# Patient Record
Sex: Female | Born: 2005 | Race: White | Hispanic: No | Marital: Single | Smoking: Never smoker
Health system: Southern US, Community
[De-identification: ages and names within clinical notes are randomized; demographics above are authoritative.]

## PROBLEM LIST (undated history)

## (undated) ENCOUNTER — Ambulatory Visit: Payer: MEDICAID

## (undated) DIAGNOSIS — K589 Irritable bowel syndrome without diarrhea: Secondary | ICD-10-CM

## (undated) DIAGNOSIS — G43909 Migraine, unspecified, not intractable, without status migrainosus: Secondary | ICD-10-CM

## (undated) DIAGNOSIS — F329 Major depressive disorder, single episode, unspecified: Secondary | ICD-10-CM

## (undated) DIAGNOSIS — K219 Gastro-esophageal reflux disease without esophagitis: Secondary | ICD-10-CM

## (undated) DIAGNOSIS — N189 Chronic kidney disease, unspecified: Secondary | ICD-10-CM

## (undated) DIAGNOSIS — Q680 Congenital deformity of sternocleidomastoid muscle: Secondary | ICD-10-CM

## (undated) DIAGNOSIS — J45909 Unspecified asthma, uncomplicated: Secondary | ICD-10-CM

## (undated) DIAGNOSIS — F419 Anxiety disorder, unspecified: Secondary | ICD-10-CM

## (undated) DIAGNOSIS — T7840XA Allergy, unspecified, initial encounter: Secondary | ICD-10-CM

## (undated) DIAGNOSIS — F32A Depression, unspecified: Secondary | ICD-10-CM

## (undated) DIAGNOSIS — G90A Postural orthostatic tachycardia syndrome (POTS): Secondary | ICD-10-CM

## (undated) HISTORY — PX: EYE MUSCLE SURGERY: SHX370

## (undated) HISTORY — DX: Depression, unspecified: F32.A

## (undated) HISTORY — DX: Migraine, unspecified, not intractable, without status migrainosus: G43.909

## (undated) HISTORY — PX: WISDOM TOOTH EXTRACTION: SHX21

## (undated) HISTORY — DX: Congenital deformity of sternocleidomastoid muscle: Q68.0

## (undated) HISTORY — DX: Postural orthostatic tachycardia syndrome (POTS): G90.A

## (undated) HISTORY — DX: Gastro-esophageal reflux disease without esophagitis: K21.9

## (undated) HISTORY — DX: Chronic kidney disease, unspecified: N18.9

## (undated) HISTORY — DX: Major depressive disorder, single episode, unspecified: F32.9

## (undated) HISTORY — DX: Allergy, unspecified, initial encounter: T78.40XA

---
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
## 2023-01-02 ENCOUNTER — Ambulatory Visit: Payer: MEDICAID

## 2023-01-07 ENCOUNTER — Ambulatory Visit (INDEPENDENT_AMBULATORY_CARE_PROVIDER_SITE_OTHER): Payer: MEDICAID | Admitting: Physician Assistant

## 2023-01-07 ENCOUNTER — Encounter: Payer: Self-pay | Admitting: Physician Assistant

## 2023-01-07 ENCOUNTER — Ambulatory Visit: Payer: MEDICAID

## 2023-01-07 VITALS — BP 96/66 | HR 90 | Temp 98.5°F | Ht 67.0 in | Wt 179.8 lb

## 2023-01-07 DIAGNOSIS — N760 Acute vaginitis: Secondary | ICD-10-CM | POA: Diagnosis not present

## 2023-01-07 DIAGNOSIS — G90A Postural orthostatic tachycardia syndrome (POTS): Secondary | ICD-10-CM | POA: Diagnosis not present

## 2023-01-07 DIAGNOSIS — R42 Dizziness and giddiness: Secondary | ICD-10-CM

## 2023-01-07 DIAGNOSIS — B9689 Other specified bacterial agents as the cause of diseases classified elsewhere: Secondary | ICD-10-CM

## 2023-01-07 MED ORDER — METRONIDAZOLE 0.75 % VA GEL
1.0000 | Freq: Every day | VAGINAL | 0 refills | Status: AC
Start: 2023-01-07 — End: 2023-01-14

## 2023-01-07 MED ORDER — METOPROLOL SUCCINATE ER 25 MG PO TB24
12.5000 mg | ORAL_TABLET | Freq: Every day | ORAL | 2 refills | Status: AC
Start: 2023-01-07 — End: ?

## 2023-01-07 NOTE — Assessment & Plan Note (Addendum)
Chronic, ongoing Patient reports she is having alternating chest pains and pressure due to lability of HR with position changes She has been seeing Cardiology and has upcoming apt with Electrophysiology this week Reviewed most recent Cardiology and PT notes  She is still taking metoprolol 12.5 mg PO every day -refills provided today  Continue collaboration with Cardiology and PT for management Still awaiting apt with [facility] in Dec  Continue current regimen for now  Follow up as needed for persistent or progressing symptoms

## 2023-01-07 NOTE — Progress Notes (Signed)
Acute Office Visit   Patient: Michelle Stokes   DOB: 2006-02-10   17 y.o. Female  MRN: [id number] Visit Date: 01/07/2023  Today's healthcare provider: Oswaldo Conroy Michelle Melder, Michelle Stokes  Introduced myself to the patient as a Secondary school teacher and provided education on APPs in clinical practice.    No chief complaint on file.  Subjective    HPI    POTS/ chest pain and pressure   She is being seen by Cardiology She reports she is feeling shaky and drained most of the time She reports some pre-syncopal episodes especially when standing up or with position changes She just completed a longterm heart monitor and was referred to electrophysiologist- has apt later this week She is scheduled with [facility] in Dec  She is doing PT and rehab- she reports it is going okay  She reports intermittent leg swelling and is using her compression stockings as directed    BV She reports the taste of oral flagyl made her feel nauseous and she was not able to take it  Requesting alternative     Medications: Outpatient Medications Prior to Visit  Medication Sig   acetaminophen (TYLENOL) 325 MG tablet Take 150 mg by mouth every 6 (six) hours as needed.   albuterol (PROVENTIL) (2.5 MG/3ML) 0.083% nebulizer solution Take 3 mLs (2.5 mg total) by nebulization every 6 (six) hours as needed for wheezing or shortness of breath.   Albuterol-Budesonide 90-80 MCG/ACT AERO Inhale 1 Inhaler into the lungs every 6 (six) hours as needed.   Cholecalciferol 1.25 MG (50000 UT) TABS Take 1 tablet by mouth 2 (two) times a week. For 12 weeks and then stop.  Return to office for lab draw.   meclizine (ANTIVERT) 25 MG tablet Take 1 tablet (25 mg total) by mouth 3 (three) times daily as needed for dizziness.   ondansetron (ZOFRAN-ODT) 4 MG disintegrating tablet Take 1 tablet (4 mg total) by mouth every 8 (eight) hours as needed for nausea or vomiting.   pantoprazole (PROTONIX) 40 MG tablet Take 40 mg by mouth daily.    [DISCONTINUED] metoprolol succinate (TOPROL-XL) 25 MG 24 hr tablet TAKE 1/2 TABLET(12.5 MG) BY MOUTH DAILY   [DISCONTINUED] medroxyPROGESTERone (PROVERA) 10 MG tablet Take 1 tablet (10 mg total) by mouth daily for 7 days.   No facility-administered medications prior to visit.    Review of Systems  Neurological:  Positive for dizziness. Negative for syncope and weakness.         Objective    BP 96/66   Pulse 90   Temp 98.5 F (36.9 C) (Oral)   Ht 5\' 7"  (1.702 m)   Wt 179 lb 12.8 oz (81.6 kg)   SpO2 96%   BMI 28.16 kg/m      Physical Exam Vitals reviewed.  Constitutional:      General: She is awake. She is not in acute distress.    Appearance: Normal appearance. She is well-developed and well-groomed. She is not ill-appearing or toxic-appearing.  HENT:     Head: Normocephalic and atraumatic.  Eyes:     General: Lids are normal. Gaze aligned appropriately.  Cardiovascular:     Rate and Rhythm: Normal rate and regular rhythm.     Heart sounds: Normal heart sounds.  Pulmonary:     Effort: Pulmonary effort is normal.     Breath sounds: Normal breath sounds. No decreased air movement. No decreased breath sounds, wheezing, rhonchi or rales.  Neurological:  General: No focal deficit present.     Mental Status: She is alert and oriented to person, place, and time. Mental status is at baseline.     GCS: GCS eye subscore is 4. GCS verbal subscore is 5. GCS motor subscore is 6.     Cranial Nerves: Cranial nerves 2-12 are intact.     Motor: No weakness, tremor or atrophy.  Psychiatric:        Attention and Perception: Attention and perception normal.        Mood and Affect: Mood and affect normal.        Speech: Speech normal.        Behavior: Behavior normal. Behavior is cooperative.       No results found for any visits on 01/07/23.  Assessment & Plan      No follow-ups on file.      Problem List Items Addressed This Visit       Cardiovascular and  Mediastinum   POTS (postural orthostatic tachycardia syndrome) - Primary    Chronic, ongoing Patient reports she is having alternating chest pains and pressure due to lability of HR with position changes She has been seeing Cardiology and has upcoming apt with Electrophysiology this week Reviewed most recent Cardiology and PT notes  She is still taking metoprolol 12.5 mg PO every day -refills provided today  Continue collaboration with Cardiology and PT for management Still awaiting apt with [facility] in Dec  Continue current regimen for now  Follow up as needed for persistent or progressing symptoms        Relevant Medications   metoprolol succinate (TOPROL-XL) 25 MG 24 hr tablet     Other   Dizzy   Relevant Medications   metoprolol succinate (TOPROL-XL) 25 MG 24 hr tablet   Other Visit Diagnoses     Bacterial vaginosis     Acute, ongoing She states she was not able to take the flagyl oral tablets due to taste Will provide vaginal gel to treat BV Follow up as needed for persistent or progressing symptoms     Relevant Medications   metroNIDAZOLE (METROGEL) 0.75 % vaginal gel        No follow-ups on file.   I, Michelle Winnett E Fable Huisman, Michelle Stokes, have reviewed all documentation for this visit. The documentation on 01/07/23 for the exam, diagnosis, procedures, and orders are all accurate and complete.   Michelle Stokes, Michelle Stokes, Michelle Stokes [facility]

## 2023-01-08 ENCOUNTER — Telehealth: Payer: Self-pay | Admitting: Cardiology

## 2023-01-08 NOTE — Telephone Encounter (Signed)
Pt calling to talk with nurse about heart monitor results and scheduling. Please advise.

## 2023-01-08 NOTE — Telephone Encounter (Signed)
Patient aware monitor results have not been finalized the provider. Once finalized we can give her a call with results.  She was concerned that EP appointment is a month out. I did inform her that they are pretty booked up as well. They do have her on cancellation list.

## 2023-01-09 ENCOUNTER — Institutional Professional Consult (permissible substitution): Payer: Self-pay | Admitting: Cardiology

## 2023-01-09 ENCOUNTER — Ambulatory Visit: Payer: MEDICAID

## 2023-01-14 ENCOUNTER — Telehealth: Payer: Self-pay | Admitting: Cardiology

## 2023-01-14 ENCOUNTER — Ambulatory Visit: Payer: MEDICAID

## 2023-01-14 DIAGNOSIS — R262 Difficulty in walking, not elsewhere classified: Secondary | ICD-10-CM

## 2023-01-14 DIAGNOSIS — M6281 Muscle weakness (generalized): Secondary | ICD-10-CM

## 2023-01-14 NOTE — Telephone Encounter (Signed)
Patient aware heart monitor results have not been finalized by provider. Duplicate encounter see previous encounter already forwarded to Dr. Bjorn Pippin

## 2023-01-14 NOTE — Telephone Encounter (Signed)
Patient is requesting call back to get update on monitor results.

## 2023-01-14 NOTE — Telephone Encounter (Signed)
Left message to call the clinic. 

## 2023-01-14 NOTE — Telephone Encounter (Signed)
Patient returning call. Please advise

## 2023-01-14 NOTE — Therapy (Signed)
OUTPATIENT PHYSICAL THERAPY TREATMENT NOTE     Patient Name: Michelle Stokes MRN: [id number] DOB:Aug 17, 2005, 17 y.o., female Today's Date: 01/14/2023   PCP: [name], NP REFERRING PROVIDER: Little Ishikawa, MD    PT End of Session - 01/14/23 0759     Visit Number 55    Number of Visits 74    Date for PT Re-Evaluation 02/25/23    Authorization Type Medicaid [place]    Authorization Time Period 08/13/22-10/08/22    Progress Note Due on Visit 60    PT Start Time 0801    PT Stop Time 0826    PT Time Calculation (min) 25 min    Equipment Utilized During Treatment Gait belt    Activity Tolerance Patient tolerated treatment well;Patient limited by fatigue    Behavior During Therapy WFL for tasks assessed/performed                         Past Medical History:  Diagnosis Date   Allergy    seasonal    Anxiety    Asthma    Chronic kidney disease    Depression    GERD (gastroesophageal reflux disease)    IBS (irritable bowel syndrome)    Migraine    POTS (postural orthostatic tachycardia syndrome)    Torticollis, congenital    Past Surgical History:  Procedure Laterality Date   EYE MUSCLE SURGERY Bilateral    WISDOM TOOTH EXTRACTION     Patient Active Problem List   Diagnosis Date Noted   Chest pressure 12/08/2022   Cutaneous abscess of left axilla 03/14/2022   Dysphagia 02/23/2022   Dizzy 01/08/2022   POTS (postural orthostatic tachycardia syndrome) 01/08/2022   GERD (gastroesophageal reflux disease) 10/06/2021   Renal scarring 09/21/2021   Overweight (BMI 25.0-29.9) 05/30/2020   Recurrent major depressive disorder, in partial remission (HCC) 03/14/2020   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   Insomnia 08/10/2018   Migraine 07/06/2018   Anxiety 07/06/2018   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   Allergic rhinitis, seasonal 05/17/2015   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   Mild intermittent asthma without complication 05/17/2015   Recurrent UTI  (urinary tract infection) 05/17/2015   VUR (vesicoureteric reflux) 05/17/2015   Sacroiliitis (HCC) 05/04/2015   Chronic pain syndrome 05/04/2015   Exotropia, intermittent, monocular 03/22/2015   Family history of eye movement disorder 03/22/2015    ONSET DATE: Diagnosed July or August of 2023  REFERRING DIAG: G90.A (ICD-10-CM) - POTS (postural orthostatic tachycardia syndrome) and constipation.  THERAPY DIAG:  Muscle weakness (generalized)  Difficulty in walking, not elsewhere classified  Rationale for Evaluation and Treatment Rehabilitation  SUBJECTIVE:  SUBJECTIVE STATEMENT: Pt reports things have not been going well. Pt reports her symptoms have been terrible: "I've been dizzy all the time. My heart rate has gone sky high. I just don't feel good most of the time." Pt has a week or two left of classes. Work is going well, but has been tough with symptoms flaring up. Pt reports knees have been sore since working out this weekend.  Pain: sore knees  Pt accompanied by: self   From PPT EVAL: URINARY FUNCTION: pt voids approx. Twice a day and is prone to UTIs, urologist has asked pt to void every 2-3 hours. She does not have nocturia. She drinks one cup of orange juice in AM, then switches to water (60 oz.+) and Gatorade zero.  BOWEL FUNCTION: pt has one bowel movement a week and is about to try a new medication for constipation. Pain with bowel movement and blood 2/2 hemorrhoids. She tries to have fiber in her diet. Not currently taking any laxatives and stool softeners. CORE STABILITY: pt denied any surgeries or accidents which would impact core. SEXUAL FUNCTION: pt has never been sexually active. Pain with tampon insertion. Never had speculum inserted. She has never attempted orgasm.   PERTINENT  HISTORY:  Pt is a 17 y.o. female referred to PT for POTS. Pt diagnosed earlier this summer. Referred to PT due to dizziness with ADL's and physical activity. Reports after hospital visit in May where she had a viral infection pt was having low energy and dizziness. MD's think could be due to onset of having COVID. She went to her PCP where she was referred to cardiology where she received her diagnosis. Before metoprolol her HR would elevate to 170 BPM with basic activities like walking or showering. On metoprolol she still has HR up to 130's. Reports medication has not helped with hot spells, dizziness, or chest pressure. Pt denies any episodes of passing out but has had presyncopal episodes. 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 Pt does report balance issues with standing tasks with normal walking. Reports difficulty with stairs and unstable surfaces like grass. Some near falls but no falls. PLOF before POTS, pt involved in sports with track and field, cheer.  Pt denies any strenuous activity levels currently, just walking at the park. On a good day, pt walking 10-15 minutes. On a bad day 5 minutes where she requires seated rest breaks. No current access to the gym or gym equipment.  Recent orders received for sacroiliitis, pt additionally to be seen as a result for her ongoing LBP.  PAIN:  Are you having pain? No.  PRECAUTIONS: Fall  WEIGHT BEARING RESTRICTIONS No  FALLS: Has patient fallen in last 6 months? 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 Improve tolerance for activity, become more active, improve symptoms.  PHPT goals: help with  constipation, decr. Abdominal pain 2/2 stool burden, improve back pain.   OBJECTIVE:   DIAGNOSTIC FINDINGS: MRI Brain 03/09/2022: Normal MRI of the brain   COGNITION: Overall cognitive status: Within functional limits for tasks assessed  POSTURE: rounded shoulders and forward head  LOWER EXTREMITY MMT:  from ortho PT  note, kept for reference:  MMT Right Eval Left Eval  Hip flexion 4 4  Hip extension    Hip abduction 4 4  Hip adduction 4 4  Hip internal rotation 3+ 3+  Hip external rotation 4 4  Knee flexion 4+ 4  Knee extension 5 5  Ankle dorsiflexion 4 4  Ankle plantarflexion    Ankle inversion    Ankle eversion    (Blank rows = not tested)  Low back assessment completed 08/13/22:  Trunk/low back assessment:  Pt indicates low-mid back pain, points to central low back Palpation: pt TTP throughout L lower back musculature (not R) and and TTP throughout lumbar spine and to mid thoracic spine  ROM assessment of lumbo-thoracic spine Lateral flexion: lacking 5-10% bilateral and painful bilateral (felt a pulling pain in her sides) Flexion lacking 20%, pain-limited Rotation WFL ROM but pain-limited bilateral, mostly felt when rotating to the L   MMT: grossly 4/5 bilat LE and pain free  Slump test: positive bilat  On plinth: Hamstring length: RLE: lacking 42 deg.  LLE: lacking 40 deg.  Pain limited bilat with measurements   BED MOBILITY:  Sit to supine Complete Independence Supine to sit Complete Independence  GAIT: Gait pattern: step through pattern, decreased arm swing- Right, decreased arm swing- Left, decreased step length- Right, decreased step length- Left, decreased hip/knee flexion- Right, decreased hip/knee flexion- Left, narrow BOS, poor foot clearance- Right, and poor foot clearance- Left Distance walked: > 200' Assistive device utilized: None Level of assistance: SBA Comments: As pt fatigues with distance, slowed gait velocity noted with decreased foot  clearances  FUNCTIONAL TESTs:  30 seconds chair stand test: 7 reps. Age matched norms for females is 24. 2 minute walk test: 265' Dynamic Gait Index: Deferred to next session Functional gait assessment: Deferred to next session   ORTHOSTATICS:  Supine:   Seated: 117/71mm Hg, HR: 86 BPM   Standing: 116/84 mm Hg, HR: 96 BPM At the end of session Seated: 126/86 HR 81  Unable to stand 3 minutes to receive data*   PATIENT SURVEYS:  FOTO 25 with target score of 46  POSTURE: rounded shoulders, increased thoracic kyphosis, and anterior pelvic tilt  PELVIC ALIGNMENT: see above.  LUMBARAROM/PROM: WFL with pain along L lower back L sidebending.  A/PROM A/PROM  eval  Flexion   Extension   Right lateral flexion   Left lateral flexion   Right rotation   Left rotation    (Blank rows = not tested)  LOWER EXTREMITY MMT:  MMT Right eval Left eval  Hip flexion 4 4  Hip extension    Hip abduction    Hip adduction    Hip internal rotation 4 3+  Hip external rotation 4 4  Knee flexion 4- 4-  Knee extension 4+ 4+  Ankle dorsiflexion 4 4  Ankle plantarflexion    Ankle inversion    Ankle eversion     (Blank rows = not tested)  LOWER EXTREMITY ROM: all WFL  Active ROM Right eval Left eval  Hip flexion    Hip extension    Hip abduction    Hip adduction    Hip internal rotation    Hip external rotation    Knee flexion    Knee extension    Ankle dorsiflexion    Ankle plantarflexion    Ankle inversion    Ankle eversion     PALPATION:   General  pt reported TTP over lower back.  PELVIC MMT:   MMT eval  Vaginal   Internal Anal Sphincter   External Anal Sphincter   Puborectalis   Diastasis Recti   (Blank rows = not tested)        TONE: Based on external palpation PFM appears to experience incr. Tension and pt utilized B glute max during PFM contraction.  PROLAPSE: Not assessed.    TODAY'S TREATMENT: 01/14/23  TE  Nustep: Lvl 1 x 1  min Lvl 2 x 1 min Lvl 3 x 1 min  Lvl 2 x 1 min  Lvl 1 x 1 min  SPM 30s-45s  Pt states, "I don't have a bunch of energy. I'm about the same." Pt reports her knees.  With 2# AW donned each LE Seated: LAQ 1x12 each LE, 1x5  "More challenging today" Seated DF 15x bilat LE STS 1x  Seated PF 15x bilat LE Seated march 1x8 each LE STS 1x   Seated adductor pball squeeze 2x15 bilat LE   Matrix cable machine: Rows @ 2.5# 3x10 Seated trunk rotation 3x5 each direction @ 2.5#   PT reviewed activity modulation outside of PT to help avoid excessive fatigue, importance of completing activity at intensity/duration that does not result in flare next day.   Session terminated early per pt request to prevent excessive fatigue.   PATIENT EDUCATION:  Education details: Pt educated throughout session about proper posture and technique with exercises. Improved exercise technique, movement at target joints, use of target muscles after min to mod verbal, visual, tactile cues.   Person educated: Patient Education method: Explanation, Demonstration, and Verbal cues Education comprehension: verbalized understanding, returned demonstration, and needs further education  HOME EXERCISE PROGRAM: Med[employer]: [email or web address]  HOME EXERCISE PROGRAM: HEP from both neuro and pelvic PT:    Nmr: Access Code: [email or web address] URL: [email or web address] Date: 12/05/2022 Prepared by: Zerita Boers  Exercises - Supine Diaphragmatic Breathing  - 1 x daily - 7 x weekly - 1 sets - 5 reps Cues and demo for proper technique.   Updated 11/14/2022: PT instructed to perform the following addition to her HEP and one other exercise from previous HEP, PT suggests focusing on side-lye hip abduction as well. Access Code: [email or web address] URL: [email or web address] Date: 11/14/2022 Prepared by: Temple Pacini  Exercises - Isometric Dead Bug  - 1 x daily - 5 x weekly - 4 sets - 1 reps - 10-15 seconds  hold  08/13/22: Access Code: [email or web address] URL: [email or web address] Date: 08/13/2022 Prepared by: Temple Pacini  Exercises - Clamshell  - 1 x daily - 5 x weekly - 2 sets - 10 reps - Seated Hamstring Stretch  - 1 x daily - 7 x weekly - 2 sets - 1 reps - 30 seconds  hold  Previous- Instructed in progressive walking program (add 5 min/week/as able)   GOALS: Goals reviewed with patient? No  SHORT TERM GOALS: Target date: 09/10/2022    Pt will be indep with HEP to improve activity tolerance and muscle strength for ADL's, work, and recreational activities Baseline: Given; 06/27/22: to be advanced; 1/15: pt reports she has mainly been walking due to migraine, recent pain; 12/03/22: Pt reports she indep with HEP Goal status: MET   LONG TERM GOALS: Target date: 10/08/2022    Pt will improve FOTO to target score of 46 to demonstrate clinically significant improvement in functional mobility. Baseline: 25; 05/31/22:  40; 06/27/22: deferred; 1/15: 45; 2/28 48; 12/03/22: 53 Goal status: MET  2.  Pt will improve 30 sec STS test to age matched  norms of 22 reps to demonstrate improvement in LE strength for standing and walking ADL completion. Baseline: 7 without UE support 05/31/22: 11 without UE support; 06/27/22: deferred; 07/09/22: 8 reps, hands-free, reports BLE felt really weak, to re-assess when pt having improved symptom day; 08/22/22:  9 reps hands-free, no light-headedness but does report she feels her heart is racing, continues to report weakness felt in BLE; 4/15: 10; 12/03/22: 11  Goal status: IN PROGRESS  3.  Pt will be able to complete full 6 minute walk test of 1000' to demonstrate ability to complete community distances without need for seated or standing rest breaks.  Baseline: 2 minute walk test completing 265'.  05/31/22: 2.5 minutes of walking (~390'), continued following rest break, 566' total in 6 minutes 06/27/22: deffered; 07/09/22: completes 5 min, discontinues due to  lightheaded sensation, HR reaches 105-135 bpm, pt completes 520 ft; 2/08/02/2022  1082 ft, completes full 6 minutes, HR around 103 bpm following testing, reports increased fatigue but no dizziness  Goal status: MET  4.  Pt will perform >/= 22/30 on FGA to demonstrate low falls risk with community balance tasks.  Baseline: 18/30 05/31/22: 25/30; Goal status: MET  5.  Pt will be independent with long term HEP to demonstrate independent management of POTS condition to perform needed household, community, and future job related tasks.  Baseline: Initial HEP given. Goal status: MET  6.  Pt will perform >/= 22/24 on DGI to demonstrate safe ambulation with community ambulation. Baseline: 17/24  05/31/22:  23/24 Goal status: MET  7.  The pt will report at least 3 day of no LBP within the past week to indicate improved functional mobility and QOL Baseline: 2/19: pt currently with chronic LBP; 2/28: Pt reports back pain within the last 3 days, mostly describes as a soreness, ache and need to pop her back; 4/15: Pt reports 5/10; 12/03/22: pt with consistent LBP still, going to start pelvic PT as adjunct Goal status: ONGOING  8.  The pt will exhibit the ability to complete a slow jog for a total of 3 minutes in order to demonstrate improved cardiovascular and LE endurance. Baseline: 12/03/22 currently unable Goal status: NEW  01/02/23 for all PHPT STGs goals: 1.) PHPT goals: Pt will be IND in HEP to improve bowel movement frequency to twice a week. Baseline:No PHPT HEP established. Goal status: NEW 2) PHPT goals: Pt will report no incr in back or pelvic pain during 30 minutes of exercise in order to perform work duties without pain. Baseline: Pt has pain with any activity. Goal status: NEW 3) Pt will demonstrate proper relaxation and mindfulness techniques and improvement in posture overall to decr. Back and PFM tension to reduce pain. Baseline: pt with LEs crossed during initial session and had not  performed mindfulness techniques. Goal status: NEW  01/30/23 target date for all LTGs: 1.) PHPT goals: Pt will be IND in HEP to improve bowel movement frequency to every other day. Baseline:No PHPT HEP established. Goal status: NEW 2) PHPT goals: Pt will report no incr in back or pelvic pain during 60 minutes of exercise in order to perform work duties without pain. Baseline: Pt has pain with any activity. Goal status: NEW 3) Pt will demonstrate proper toileting posture to fully empty bladder and not strain during bowel movement. Pt will also void every 2-3 hours. Baseline: voids twice a day. Not using stool for bowel movement and not leaning forward to void. Goal status: NEW   ASSESSMENT:  CLINICAL IMPRESSION: Pt presents to PT with reports of symptom flare.  Activities modified and pt monitored throughout to ensure pt does not exhibit excessive fatigue or become increasingly symptomatic. She tolerated interventions fair, and session was ended early to prevent excessive fatigue.The pt will benefit from further skilled PT in order to decrease pain, fall risk, and improve mobility, strength, endurance and QOL.     OBJECTIVE IMPAIRMENTS Abnormal gait, cardiopulmonary status limiting activity, decreased activity tolerance, decreased endurance, difficulty walking, decreased strength, and dizziness.   ACTIVITY LIMITATIONS carrying, lifting, standing, squatting, stairs, transfers, bathing, hygiene/grooming, and locomotion level  PARTICIPATION LIMITATIONS: cleaning, community activity, occupation, and school  PERSONAL FACTORS Age, Fitness, Past/current experiences, Sex, Time since onset of injury/illness/exacerbation, and 3+ comorbidities: CKD, depression, anxiety,  are also affecting patient's functional outcome.   REHAB POTENTIAL: Good  CLINICAL DECISION MAKING: Evolving/moderate complexity  EVALUATION COMPLEXITY: Moderate  PLAN: PT FREQUENCY: 2x/week  PT DURATION: 12  weeks  PLANNED INTERVENTIONS: Therapeutic exercises, Therapeutic activity, Neuromuscular re-education, Balance training, Gait training, Patient/Family education, Self Care, Stair training, and Vestibular training  PLAN FOR NEXT SESSION:  Continue to Use of RPE scale due to being on beta blocker, progressive strengthening and endurance to tolerance, continue plan, monitor for pain with interventions, cardio, standing therex to tolerance, address LBP, hamstring length and neural tension deficits, floor transfers, LE strenghtening, bending   Temple Pacini PT, DPT  01/14/23 8:32 AM Phone: [phone] Fax: [phone]

## 2023-01-16 ENCOUNTER — Ambulatory Visit: Payer: MEDICAID

## 2023-01-21 ENCOUNTER — Ambulatory Visit: Payer: MEDICAID

## 2023-01-23 ENCOUNTER — Ambulatory Visit: Payer: MEDICAID

## 2023-01-24 ENCOUNTER — Telehealth: Payer: Self-pay

## 2023-01-24 NOTE — Telephone Encounter (Addendum)
Results viewed by patient via MyChart----- Message from Joni Reining sent at 01/21/2023  7:40 AM EDT ----- I have reviewed the monitor. No arrhythmias.  Average HR was 82 bpm. Max HR 181, Lowest rate was 48 bpm.  Being seen by EP. The patient trigger events were normal rhythm.

## 2023-01-28 ENCOUNTER — Ambulatory Visit: Payer: MEDICAID | Attending: Cardiology

## 2023-01-28 DIAGNOSIS — M6281 Muscle weakness (generalized): Secondary | ICD-10-CM | POA: Diagnosis not present

## 2023-01-28 DIAGNOSIS — R262 Difficulty in walking, not elsewhere classified: Secondary | ICD-10-CM | POA: Diagnosis present

## 2023-01-28 NOTE — Therapy (Signed)
OUTPATIENT PHYSICAL THERAPY TREATMENT NOTE     Patient Name: Michelle Stokes MRN: [id number] DOB:[birth year], 17 y.o., female Today's Date: 01/28/2023   PCP: [name], NP REFERRING PROVIDER: Little Ishikawa, MD    PT End of Session - 01/28/23 0809     Visit Number 56    Number of Visits 74    Date for PT Re-Evaluation 02/25/23    Authorization Type Medicaid [place]    Authorization Time Period 08/13/22-10/08/22    Progress Note Due on Visit 60    PT Start Time 0803    PT Stop Time 0837    PT Time Calculation (min) 34 min    Equipment Utilized During Treatment Gait belt    Activity Tolerance Patient tolerated treatment well;Patient limited by fatigue    Behavior During Therapy WFL for tasks assessed/performed                          Past Medical History:  Diagnosis Date   Allergy    seasonal    Anxiety    Asthma    Chronic kidney disease    Depression    GERD (gastroesophageal reflux disease)    IBS (irritable bowel syndrome)    Migraine    POTS (postural orthostatic tachycardia syndrome)    Torticollis, congenital    Past Surgical History:  Procedure Laterality Date   EYE MUSCLE SURGERY Bilateral    WISDOM TOOTH EXTRACTION     Patient Active Problem List   Diagnosis Date Noted   Chest pressure 12/08/2022   Cutaneous abscess of left axilla 03/14/2022   Dysphagia 02/23/2022   Dizzy 01/08/2022   POTS (postural orthostatic tachycardia syndrome) 01/08/2022   GERD (gastroesophageal reflux disease) 10/06/2021   Renal scarring 09/21/2021   Overweight (BMI 25.0-29.9) 05/30/2020   Recurrent major depressive disorder, in partial remission (HCC) 03/14/2020   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   Insomnia 08/10/2018   Migraine 07/06/2018   Anxiety 07/06/2018   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   Allergic rhinitis, seasonal 05/17/2015   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   Mild intermittent asthma without complication 05/17/2015   Recurrent UTI  (urinary tract infection) 05/17/2015   VUR (vesicoureteric reflux) 05/17/2015   Sacroiliitis (HCC) 05/04/2015   Chronic pain syndrome 05/04/2015   Exotropia, intermittent, monocular 03/22/2015   Family history of eye movement disorder 03/22/2015    ONSET DATE: Diagnosed July or August of 2023  REFERRING DIAG: G90.A (ICD-10-CM) - POTS (postural orthostatic tachycardia syndrome) and constipation.  THERAPY DIAG:  Muscle weakness (generalized)  Difficulty in walking, not elsewhere classified  Rationale for Evaluation and Treatment Rehabilitation  SUBJECTIVE:  SUBJECTIVE STATEMENT: Pt tired today, reports knee and ankle pain.   Pain: LE/ankle pain  Pt accompanied by: self   From PPT EVAL: URINARY FUNCTION: pt voids approx. Twice a day and is prone to UTIs, urologist has asked pt to void every 2-3 hours. She does not have nocturia. She drinks one cup of orange juice in AM, then switches to water (60 oz.+) and Gatorade zero.  BOWEL FUNCTION: pt has one bowel movement a week and is about to try a new medication for constipation. Pain with bowel movement and blood 2/2 hemorrhoids. She tries to have fiber in her diet. Not currently taking any laxatives and stool softeners. CORE STABILITY: pt denied any surgeries or accidents which would impact core. SEXUAL FUNCTION: pt has never been sexually active. Pain with tampon insertion. Never had speculum inserted. She has never attempted orgasm.   PERTINENT HISTORY:  Pt is a 17 y.o. female referred to PT for POTS. Pt diagnosed earlier this summer. Referred to PT due to dizziness with ADL's and physical activity. Reports after hospital visit in May where she had a viral infection pt was having low energy and dizziness. MD's think could be due to onset of having  COVID. She went to her PCP where she was referred to cardiology where she received her diagnosis. Before metoprolol her HR would elevate to 170 BPM with basic activities like walking or showering. On metoprolol she still has HR up to 130's. Reports medication has not helped with hot spells, dizziness, or chest pressure. Pt denies any episodes of passing out but has had presyncopal episodes. 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 Pt does report balance issues with standing tasks with normal walking. Reports difficulty with stairs and unstable surfaces like grass. Some near falls but no falls. PLOF before POTS, pt involved in sports with track and field, cheer.  Pt denies any strenuous activity levels currently, just walking at the park. On a good day, pt walking 10-15 minutes. On a bad day 5 minutes where she requires seated rest breaks. No current access to the gym or gym equipment.  Recent orders received for sacroiliitis, pt additionally to be seen as a result for her ongoing LBP.  PAIN:  Are you having pain? No.  PRECAUTIONS: Fall  WEIGHT BEARING RESTRICTIONS No  FALLS: Has patient fallen in last 6 months? 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 Improve tolerance for activity, become more active, improve symptoms.  PHPT goals: help with constipation, decr. Abdominal pain 2/2 stool burden, improve back pain.   OBJECTIVE:   DIAGNOSTIC FINDINGS: MRI Brain 03/09/2022: Normal MRI of the brain   COGNITION: Overall cognitive status: Within functional limits for tasks assessed  POSTURE: rounded shoulders and forward head  LOWER EXTREMITY MMT:   from ortho PT note, kept for reference:  MMT Right Eval Left Eval  Hip flexion 4 4  Hip extension    Hip abduction 4 4  Hip adduction 4 4  Hip internal rotation 3+ 3+  Hip external rotation 4 4  Knee flexion 4+ 4  Knee extension 5 5  Ankle dorsiflexion 4 4  Ankle plantarflexion  Ankle inversion    Ankle eversion    (Blank rows = not tested)  Low back assessment completed 08/13/22:  Trunk/low back assessment:  Pt indicates low-mid back pain, points to central low back Palpation: pt TTP throughout L lower back musculature (not R) and and TTP throughout lumbar spine and to mid thoracic spine  ROM assessment of lumbo-thoracic spine Lateral flexion: lacking 5-10% bilateral and painful bilateral (felt a pulling pain in her sides) Flexion lacking 20%, pain-limited Rotation WFL ROM but pain-limited bilateral, mostly felt when rotating to the L   MMT: grossly 4/5 bilat LE and pain free  Slump test: positive bilat  On plinth: Hamstring length: RLE: lacking 42 deg.  LLE: lacking 40 deg.  Pain limited bilat with measurements   BED MOBILITY:  Sit to supine Complete Independence Supine to sit Complete Independence  GAIT: Gait pattern: step through pattern, decreased arm swing- Right, decreased arm swing- Left, decreased step length- Right, decreased step length- Left, decreased hip/knee flexion- Right, decreased hip/knee flexion- Left, narrow BOS, poor foot clearance- Right, and poor foot clearance- Left Distance walked: > 200' Assistive device utilized: None Level of assistance: SBA Comments: As pt fatigues with distance, slowed gait velocity noted with decreased foot clearances  FUNCTIONAL TESTs:  30 seconds chair stand test: 7 reps. Age matched norms for females is 24. 2 minute walk test: 265' Dynamic Gait Index: Deferred to next session Functional gait assessment: Deferred to next session   ORTHOSTATICS:  Supine:   Seated: 117/15mm Hg, HR: 86 BPM   Standing:  116/84 mm Hg, HR: 96 BPM At the end of session Seated: 126/86 HR 81  Unable to stand 3 minutes to receive data*   PATIENT SURVEYS:  FOTO 25 with target score of 46  POSTURE: rounded shoulders, increased thoracic kyphosis, and anterior pelvic tilt  PELVIC ALIGNMENT: see above.  LUMBARAROM/PROM: WFL with pain along L lower back L sidebending.  A/PROM A/PROM  eval  Flexion   Extension   Right lateral flexion   Left lateral flexion   Right rotation   Left rotation    (Blank rows = not tested)  LOWER EXTREMITY MMT:  MMT Right eval Left eval  Hip flexion 4 4  Hip extension    Hip abduction    Hip adduction    Hip internal rotation 4 3+  Hip external rotation 4 4  Knee flexion 4- 4-  Knee extension 4+ 4+  Ankle dorsiflexion 4 4  Ankle plantarflexion    Ankle inversion    Ankle eversion     (Blank rows = not tested)  LOWER EXTREMITY ROM: all WFL  Active ROM Right eval Left eval  Hip flexion    Hip extension    Hip abduction    Hip adduction    Hip internal rotation    Hip external rotation    Knee flexion    Knee extension    Ankle dorsiflexion    Ankle plantarflexion    Ankle inversion    Ankle eversion     PALPATION:   General  pt reported TTP over lower back.              PELVIC MMT:   MMT eval  Vaginal   Internal Anal Sphincter   External Anal Sphincter   Puborectalis   Diastasis Recti   (Blank rows = not tested)        TONE: Based on external palpation PFM appears to experience incr. Tension and pt utilized B  glute max during PFM contraction.  PROLAPSE: Not assessed.    TODAY'S TREATMENT: 01/28/23  TE  Nustep: Lvl 1-2 x 5 min (briefly attempted LVL 3, but limited due to LE pain). SPM 60s-70s. PT adjusts intensity of intervention and monitors pt for response throughout.  With 3# AW donned each LE: LAQ 1x10, 1x15 each LE - pt reports some fatigue, but not difficult Seated DF 2x20 - rates pretty easy Standing hip abduction 5x  each LE - limited due to symptoms - pt takes seated rest break with ice pack donned on shoulder Seated DF x10 bilat - limited by LLE anterior ankle and knee pain  STS 2x - makes pt slighlty dizzy, decreases quickly with rest   Postural strengthening: RTB shoulder ER/pull-apart 2x10 BUE Seated trunk twists 5x each way - full range is pain limited bilaterally, pt is able to find comfortable AROM Seated thoracic ext over chair 2x10  Seated with pball ball adductor isometric: -Hip IR each side 3x10  -Hip ER  3x10 each LE  - pball adductor squeezes 3x10 bilat   Seated pelvic tilts anterior to posterior 2x10 - fatiguing but improves LBP  Seated hip abd with RTB 3x10 bilat   PATIENT EDUCATION:  Education details: Pt educated throughout session about proper posture and technique with exercises. Improved exercise technique, movement at target joints, use of target muscles after min to mod verbal, visual, tactile cues.   Person educated: Patient Education method: Explanation, Demonstration, and Verbal cues Education comprehension: verbalized understanding, returned demonstration, and needs further education  HOME EXERCISE PROGRAM: Medbridge: [email or web address]  HOME EXERCISE PROGRAM: HEP from both neuro and pelvic PT:    Nmr: Access Code: [email or web address] URL: [email or web address] Date: 12/05/2022 Prepared by: Zerita Boers  Exercises - Supine Diaphragmatic Breathing  - 1 x daily - 7 x weekly - 1 sets - 5 reps Cues and demo for proper technique.   Updated 11/14/2022: PT instructed to perform the following addition to her HEP and one other exercise from previous HEP, PT suggests focusing on side-lye hip abduction as well. Access Code: [email or web address] URL: [email or web address] Date: 11/14/2022 Prepared by: Temple Pacini  Exercises - Isometric Dead Bug  - 1 x daily - 5 x weekly - 4 sets - 1 reps - 10-15 seconds hold  08/13/22: Access Code: [email or web address] URL:  [email or web address] Date: 08/13/2022 Prepared by: Temple Pacini  Exercises - Clamshell  - 1 x daily - 5 x weekly - 2 sets - 10 reps - Seated Hamstring Stretch  - 1 x daily - 7 x weekly - 2 sets - 1 reps - 30 seconds  hold  Previous- Instructed in progressive walking program (add 5 min/week/as able)   GOALS: Goals reviewed with patient? No  SHORT TERM GOALS: Target date: 09/10/2022    Pt will be indep with HEP to improve activity tolerance and muscle strength for ADL's, work, and recreational activities Baseline: Given; 06/27/22: to be advanced; 1/15: pt reports she has mainly been walking due to migraine, recent pain; 12/03/22: Pt reports she indep with HEP Goal status: MET   LONG TERM GOALS: Target date: 10/08/2022    Pt will improve FOTO to target score of 46 to demonstrate clinically significant improvement in functional mobility. Baseline: 25; 05/31/22:  40; 06/27/22: deferred; 1/15: 45; 2/28 48; 12/03/22: 53 Goal status: MET  2.  Pt will improve 30 sec STS test to age matched norms of 22 reps to demonstrate improvement in LE strength for standing and walking ADL completion. Baseline: 7 without  UE support 05/31/22: 11 without UE support; 06/27/22: deferred; 07/09/22: 8 reps, hands-free, reports BLE felt really weak, to re-assess when pt having improved symptom day; 08/22/22:  9 reps hands-free, no light-headedness but does report she feels her heart is racing, continues to report weakness felt in BLE; 4/15: 10; 12/03/22: 11  Goal status: IN PROGRESS  3.  Pt will be able to complete full 6 minute walk test of 1000' to demonstrate ability to complete community distances without need for seated or standing rest breaks.  Baseline: 2 minute walk test completing 265'.  05/31/22: 2.5 minutes of walking (~390'), continued following rest break, 566' total in 6 minutes 06/27/22: deffered; 07/09/22: completes 5 min, discontinues due to lightheaded sensation, HR reaches 105-135 bpm, pt  completes 520 ft; 2/08/02/2022  1082 ft, completes full 6 minutes, HR around 103 bpm following testing, reports increased fatigue but no dizziness  Goal status: MET  4.  Pt will perform >/= 22/30 on FGA to demonstrate low falls risk with community balance tasks.  Baseline: 18/30 05/31/22: 25/30; Goal status: MET  5.  Pt will be independent with long term HEP to demonstrate independent management of POTS condition to perform needed household, community, and future job related tasks.  Baseline: Initial HEP given. Goal status: MET  6.  Pt will perform >/= 22/24 on DGI to demonstrate safe ambulation with community ambulation. Baseline: 17/24  05/31/22:  23/24 Goal status: MET  7.  The pt will report at least 3 day of no LBP within the past week to indicate improved functional mobility and QOL Baseline: 2/19: pt currently with chronic LBP; 2/28: Pt reports back pain within the last 3 days, mostly describes as a soreness, ache and need to pop her back; 4/15: Pt reports 5/10; 12/03/22: pt with consistent LBP still, going to start pelvic PT as adjunct Goal status: ONGOING  8.  The pt will exhibit the ability to complete a slow jog for a total of 3 minutes in order to demonstrate improved cardiovascular and LE endurance. Baseline: 12/03/22 currently unable Goal status: NEW  01/02/23 for all PHPT STGs goals: 1.) PHPT goals: Pt will be IND in HEP to improve bowel movement frequency to twice a week. Baseline:No PHPT HEP established. Goal status: NEW 2) PHPT goals: Pt will report no incr in back or pelvic pain during 30 minutes of exercise in order to perform work duties without pain. Baseline: Pt has pain with any activity. Goal status: NEW 3) Pt will demonstrate proper relaxation and mindfulness techniques and improvement in posture overall to decr. Back and PFM tension to reduce pain. Baseline: pt with LEs crossed during initial session and had not performed mindfulness techniques. Goal status:  NEW  01/30/23 target date for all LTGs: 1.) PHPT goals: Pt will be IND in HEP to improve bowel movement frequency to every other day. Baseline:No PHPT HEP established. Goal status: NEW 2) PHPT goals: Pt will report no incr in back or pelvic pain during 60 minutes of exercise in order to perform work duties without pain. Baseline: Pt has pain with any activity. Goal status: NEW 3) Pt will demonstrate proper toileting posture to fully empty bladder and not strain during bowel movement. Pt will also void every 2-3 hours. Baseline: voids twice a day. Not using stool for bowel movement and not leaning forward to void. Goal status: NEW   ASSESSMENT:  CLINICAL IMPRESSION: Pt still somewhat limited by fatigue today, but is able to complete 1 standing exercise. She is  also limited slightly by LE pain. Will continue to monitor and progress pt as able. The pt will benefit from further skilled PT in order to decrease pain, fall risk, and improve mobility, strength, endurance and QOL.     OBJECTIVE IMPAIRMENTS Abnormal gait, cardiopulmonary status limiting activity, decreased activity tolerance, decreased endurance, difficulty walking, decreased strength, and dizziness.   ACTIVITY LIMITATIONS carrying, lifting, standing, squatting, stairs, transfers, bathing, hygiene/grooming, and locomotion level  PARTICIPATION LIMITATIONS: cleaning, community activity, occupation, and school  PERSONAL FACTORS Age, Fitness, Past/current experiences, Sex, Time since onset of injury/illness/exacerbation, and 3+ comorbidities: CKD, depression, anxiety,  are also affecting patient's functional outcome.   REHAB POTENTIAL: Good  CLINICAL DECISION MAKING: Evolving/moderate complexity  EVALUATION COMPLEXITY: Moderate  PLAN: PT FREQUENCY: 2x/week  PT DURATION: 12 weeks  PLANNED INTERVENTIONS: Therapeutic exercises, Therapeutic activity, Neuromuscular re-education, Balance training, Gait training, Patient/Family  education, Self Care, Stair training, and Vestibular training  PLAN FOR NEXT SESSION:  Continue to Use of RPE scale due to being on beta blocker, progressive strengthening and endurance to tolerance, continue plan, monitor for pain with interventions, cardio, standing therex to tolerance, address LBP, hamstring length and neural tension deficits, floor transfers, LE strenghtening, bending   Temple Pacini PT, DPT  01/28/23 12:52 PM Phone: [phone] Fax: [phone]

## 2023-01-30 ENCOUNTER — Ambulatory Visit: Payer: MEDICAID

## 2023-02-04 ENCOUNTER — Ambulatory Visit: Payer: MEDICAID

## 2023-02-04 ENCOUNTER — Institutional Professional Consult (permissible substitution): Payer: Medicaid Other | Admitting: Cardiovascular Disease

## 2023-02-05 ENCOUNTER — Ambulatory Visit: Payer: MEDICAID | Attending: Cardiovascular Disease | Admitting: Cardiology

## 2023-02-05 ENCOUNTER — Encounter: Payer: Self-pay | Admitting: Cardiology

## 2023-02-05 VITALS — BP 110/58 | HR 91 | Ht 67.0 in | Wt 179.0 lb

## 2023-02-05 DIAGNOSIS — G90A Postural orthostatic tachycardia syndrome (POTS): Secondary | ICD-10-CM

## 2023-02-05 DIAGNOSIS — R002 Palpitations: Secondary | ICD-10-CM

## 2023-02-05 NOTE — Patient Instructions (Signed)
Medication Instructions:  The current medical regimen is effective;  continue present plan and medications.  *If you need a refill on your cardiac medications before your next appointment, please call your pharmacy*   Follow-Up: At [facility], you and your health needs are our priority.  As part of our continuing mission to provide you with exceptional heart care, we have created designated Provider Care Teams.  These Care Teams include your primary Cardiologist (physician) and Advanced Practice Providers (APPs -  Physician Assistants and Nurse Practitioners) who all work together to provide you with the care you need, when you need it.  We recommend signing up for the patient portal called "MyChart".  Sign up information is provided on this After Visit Summary.  MyChart is used to connect with patients for Virtual Visits (Telemedicine).  Patients are able to view lab/test results, encounter notes, upcoming appointments, etc.  Non-urgent messages can be sent to your provider as well.   To learn more about what you can do with MyChart, go to [email or web address].    Your next appointment:   As needed  Provider:   Steffanie Dunn, MD

## 2023-02-05 NOTE — Progress Notes (Signed)
Electrophysiology Office Note:    Date:  02/05/2023   ID:  Michelle Stokes, DOB 02-26-2006, MRN [id number]  [facility] Cardiologist:  Little Ishikawa, MD  [facility] Electrophysiologist:  Lanier Prude, MD   Referring MD: Jodelle Gross, NP   Chief Complaint: Palpitations  History of Present Illness:    Michelle Stokes is a 17 y.o. femalewho I am seeing today for an evaluation of palpitations at the request of Joni Reining, NP.  The patient was last seen by Santina Evans on December 20, 2022..  The patient has a medical history that includes GERD, depression, CKD, palpitations.  She has been told that she has POTS in the past.  She reports palpitations, dizziness and lightheadedness with changes in position.  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  She is awaiting an appointment with the [facility] clinic.  -------------  Today she confirms the above.  She has an appointment with Dr. Lollie Sails at [facility] in December.  She tells me that her symptoms started in May 2023 after a viral infection.  She was seen in the hospital where she was told she was "septic".  She was started on "broad-spectrum antibiotics".  After that illness she felt persistently dizzy.  She also reports shakiness in her upper extremities and chronic fatigue.  She tells me that at times she has a very "low heart rate" but other times that she has a significantly increased heart rate.  Standing seems to precipitate her elevated heart rates.  She feels chest pressure at times.  She has been told she has "chronic kidney disease stage I".  For this reason she has been reluctant to increase her sodium intake.  She has previously been referred for physical therapy and participates there for her POTS diagnosis.  She focuses on recumbent exercises but also does walk on a treadmill regularly.  She is lost 20 pounds in the last 3 months after having a significant amount of dental work performed.  She drinks at least 60  ounces of water per day.  She also will include Gatorade into that volume.  She wants to pursue a nursing degree.         Their past medical, social and family history was reveiwed.   ROS:   Please see the history of present illness.    All other systems reviewed and are negative.  EKGs/Labs/Other Studies Reviewed:    The following studies were reviewed today:  January 20, 2023 ZIO monitor personally reviewed Heart rate 48-1 81, average 82 Rare supraventricular and ventricular ectopy No sustained arrhythmias No atrial fibrillation  EKG Interpretation Date/Time:  Tuesday February 05 2023 14:05:18 EDT Ventricular Rate:  91 PR Interval:  118 QRS Duration:  82 QT Interval:  348 QTC Calculation: 428 R Axis:   79  Text Interpretation: Normal sinus rhythm with sinus arrhythmia Normal ECG Confirmed by Steffanie Dunn [phone]) on 02/05/2023 2:08:35 PM    Physical Exam:    VS:  BP (!) 110/58   Pulse 91   Ht 5\' 7"  (1.702 m)   Wt 179 lb (81.2 kg)   SpO2 97%   BMI 28.04 kg/m     Wt Readings from Last 3 Encounters:  02/05/23 179 lb (81.2 kg) (95%, Z= 1.60)*  01/07/23 179 lb 12.8 oz (81.6 kg) (95%, Z= 1.62)*  12/20/22 180 lb 3.2 oz (81.7 kg) (95%, Z= 1.63)*   * Growth percentiles are based on CDC (Girls, 2-20 Years) data.  GEN:  Well nourished, well developed in no acute distress CARDIAC: RRR, no murmurs, rubs, gallops RESPIRATORY:  Clear to auscultation without rales, wheezing or rhonchi       ASSESSMENT AND PLAN:    1. Palpitations   2. POTS (postural orthostatic tachycardia syndrome)     #Palpitations #POTS Patient has a history of palpitations, worse with standing.  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  I have encouraged her to stay adequately hydrated (at least 3 L daily).  She should liberalize her salt intake (8 to 12 g).  She should elevate the head of the bed at night by 6 inches.  Consider compression stockings at least to the  knees during the daytime hours.  Discussed the importance of aerobic exercise in the management of POTS.  This should be recumbent if possible.  Encouraged her to continue work with physical therapy.  I have encouraged her to keep her appointment Dr. Lollie Sails at [facility].  Follow-up with our EP clinic as needed.    Signed, Rossie Muskrat. Lalla Brothers, MD, [facility], [facility] 02/05/2023 2:08 PM    Electrophysiology [facility]

## 2023-02-06 ENCOUNTER — Ambulatory Visit: Payer: MEDICAID

## 2023-02-06 ENCOUNTER — Encounter: Payer: Self-pay | Admitting: Obstetrics and Gynecology

## 2023-02-11 ENCOUNTER — Ambulatory Visit: Payer: MEDICAID

## 2023-02-11 ENCOUNTER — Telehealth: Payer: Self-pay

## 2023-02-11 NOTE — Telephone Encounter (Signed)
PT contacted pt this morning via secure line. Pt originally listed on schedule this a.m. for 845 appointment time, but reports she called around 700 am this morning to cancel appointment for today.  Pt is not a no-show/is on schedule in error.  Temple Pacini PT, DPT

## 2023-02-13 ENCOUNTER — Ambulatory Visit: Payer: MEDICAID

## 2023-02-20 ENCOUNTER — Ambulatory Visit: Payer: Self-pay

## 2023-02-20 NOTE — Telephone Encounter (Signed)
      Chief Complaint: Has POTS and is having dizziness "more frequently this week." Unsure of pulse today. No chest pain. Has been seen by cardiology. Declines in office appointment."If I have to come in I will." Virtual appointment made at pt. Request. Symptoms: Dizziness, increased heart rate. Conservative treatment per cardiology per pt. Frequency: Monday Pertinent Negatives: Patient denies chest pain. Disposition: [] ED /[] Urgent Care (no appt availability in office) / [x] Appointment(In office/virtual)/ []  [facility]/ [] Home Care/ [] Refused Recommended Disposition /[] [facility]/ []  Follow-up with PCP Additional Notes: Advised to go to ED for worsening of symptoms. Please advise pt. In regard to VV.  Reason for Disposition  [1] MODERATE dizziness (e.g., interferes with normal activities) AND [2] has been evaluated by doctor (or NP/PA) for this  Answer Assessment - Initial Assessment Questions 1. DESCRIPTION: "Describe your dizziness."     Head rush 2. LIGHTHEADED: "Do you feel lightheaded?" (e.g., somewhat faint, woozy, weak upon standing)     Woozy 3. VERTIGO: "Do you feel like either you or the room is spinning or tilting?" (i.e. vertigo)     No 4. SEVERITY: "How bad is it?"  "Do you feel like you are going to faint?" "Can you stand and walk?"   - MILD: Feels slightly dizzy, but walking normally.   - MODERATE: Feels unsteady when walking, but not falling; interferes with normal activities (e.g., school, work).   - SEVERE: Unable to walk without falling, or requires assistance to walk without falling; feels like passing out now.      Moderate 5. ONSET:  "When did the dizziness begin?"     Monday 6. AGGRAVATING FACTORS: "Does anything make it worse?" (e.g., standing, change in head position)     Standing 7. HEART RATE: "Can you tell me your heart rate?" "How many beats in 15 seconds?"  (Note: not all patients can do this)       Sometimes 120 8. CAUSE: "What  do you think is causing the dizziness?"     POTS 9. RECURRENT SYMPTOM: "Have you had dizziness before?" If Yes, ask: "When was the last time?" "What happened that time?"     Yes 10. OTHER SYMPTOMS: "Do you have any other symptoms?" (e.g., fever, chest pain, vomiting, diarrhea, bleeding)       No 11. PREGNANCY: "Is there any chance you are pregnant?" "When was your last menstrual period?"       No  Protocols used: Dizziness - Lightheadedness-A-AH

## 2023-02-21 ENCOUNTER — Telehealth (INDEPENDENT_AMBULATORY_CARE_PROVIDER_SITE_OTHER): Payer: MEDICAID | Admitting: Physician Assistant

## 2023-02-21 ENCOUNTER — Encounter: Payer: Self-pay | Admitting: Physician Assistant

## 2023-02-21 DIAGNOSIS — R300 Dysuria: Secondary | ICD-10-CM

## 2023-02-21 DIAGNOSIS — G90A Postural orthostatic tachycardia syndrome (POTS): Secondary | ICD-10-CM

## 2023-02-21 DIAGNOSIS — B9689 Other specified bacterial agents as the cause of diseases classified elsewhere: Secondary | ICD-10-CM

## 2023-02-21 DIAGNOSIS — B3731 Acute candidiasis of vulva and vagina: Secondary | ICD-10-CM

## 2023-02-21 DIAGNOSIS — R42 Dizziness and giddiness: Secondary | ICD-10-CM | POA: Diagnosis not present

## 2023-02-21 DIAGNOSIS — R110 Nausea: Secondary | ICD-10-CM | POA: Diagnosis not present

## 2023-02-21 DIAGNOSIS — N760 Acute vaginitis: Secondary | ICD-10-CM

## 2023-02-21 MED ORDER — MIDODRINE HCL 2.5 MG PO TABS
2.5000 mg | ORAL_TABLET | Freq: Three times a day (TID) | ORAL | 0 refills | Status: AC
Start: 2023-02-21 — End: ?

## 2023-02-21 MED ORDER — ONDANSETRON HCL 4 MG PO TABS: 4.0000 mg | ORAL_TABLET | Freq: Three times a day (TID) | ORAL | 0 refills | Status: AC | PRN

## 2023-02-21 NOTE — Progress Notes (Unsigned)
Virtual Visit via Video Note  I connected with Michelle Stokes on 02/27/23 at  2:40 PM EDT by a video enabled telemedicine application and verified that I am speaking with the correct person using two identifiers.  Today's Provider: Jacquelin Hawking, MHS, PA-C Introduced myself to the patient as a PA-C and provided education on APPs in clinical practice.   Location: Patient: at home  Provider: [facility], [place], [place]    I discussed the limitations of evaluation and management by telemedicine and the availability of in person appointments. The patient expressed understanding and agreed to proceed.   Chief Complaint  Patient presents with   Dizziness    Pt states symptoms has been going on since Monday   Nausea    History of Present Illness:    Dizziness She reports since Monday she has been getting headrushes, ear ringing, and presyncope every time she stands up She reports reduced appetite, fatigue and nausea  She is still taking Metoprolol as directed   She is also concerned for some discomfort when she urinates so she is concerned for potential yeast infection  She reports she has been checking her BP States her BP has been rising when she stands and drops low when she sits down Standing: 130s/ 70s and 140s/ 70s  Sitting: 100s/70s   She reports her pulse has been staying high  She thinks her pulse has been higher than normal and she is having palpitations     Review of Systems  Respiratory:  Negative for shortness of breath and wheezing.   Cardiovascular:  Positive for palpitations.  Neurological:  Positive for dizziness. Negative for loss of consciousness and headaches.    Observations/Objective:  Due to the nature of the virtual visit, physical exam and observations are limited. Able to obtain the following observations:   Alert, oriented, x3  Appears comfortable, in no acute distress.  No scleral injection, no appreciated hoarseness,  tachypnea, wheeze or strider. Able to maintain conversation without visible strain.  No cough appreciated during visit.     Assessment and Plan:  Problem List Items Addressed This Visit       Cardiovascular and Mediastinum   POTS (postural orthostatic tachycardia syndrome) - Primary    Chronic, historic condition, ongoing Patient reports that she has had increasing dizziness and "head rushes", ear ringing and presyncope every time she stands up since Monday. She has been taking her metoprolol 12.5 mg p.o. daily as directed.  She reports that despite taking this her pulse has been staying high and she thinks that it has been higher than her normal as she is having palpitations  At this time I think it might be pertinent to try midodrine. Will start midodrine 2.5 mg p.o. 3 times daily with meals and recommend that she stops the metoprolol. Recommend that she continues with her other POTS management techniques such as increased water consumption, salt intake, physical therapy. We reviewed that she might have some rebound tachycardia with stopping the metoprolol and if she notices her heart rate staying elevated she can take a dose as needed Recommend follow-up in about 2 weeks to assess response to medication changes or sooner if concerns arise       Relevant Medications   midodrine (PROAMATINE) 2.5 MG tablet     Other   Dizzy   Relevant Medications   ondansetron (ZOFRAN) 4 MG tablet   Other Visit Diagnoses     Dysuria  Patient to stop by office to provide urine sample for UA and complete wet prep Results of testing to dictate further management UA did not appear indicative of UTI at this time but wet prep did have clue cells and yeast Will send in Flagyl 500 mg p.o. twice daily x 7 days along with Diflucan for management Follow-up as needed for progressing or persistent symptoms   Relevant Orders   Urinalysis, Routine w reflex microscopic (Completed)   WET PREP FOR TRICH,  YEAST, CLUE (Completed)   Nausea       Relevant Medications   ondansetron (ZOFRAN) 4 MG tablet   BV (bacterial vaginosis)       Relevant Medications   metroNIDAZOLE (FLAGYL) 500 MG tablet   fluconazole (DIFLUCAN) 150 MG tablet   Yeast vaginitis       Relevant Medications   metroNIDAZOLE (FLAGYL) 500 MG tablet   fluconazole (DIFLUCAN) 150 MG tablet      Follow Up Instructions:    I discussed the assessment and treatment plan with the patient. The patient was provided an opportunity to ask questions and all were answered. The patient agreed with the plan and demonstrated an understanding of the instructions.   The patient was advised to call back or seek an in-person evaluation if the symptoms worsen or if the condition fails to improve as anticipated.  I provided 14 minutes of non-face-to-face time during this encounter.    Return in about 2 weeks (around 03/07/2023) for POTS - med changes .   I, Jacquelyn Antony E Iyari Hagner, PA-C, have reviewed all documentation for this visit. The documentation on 02/27/23 for the exam, diagnosis, procedures, and orders are all accurate and complete.   Jacquelin Hawking, MHS, PA-C [facility]

## 2023-02-22 ENCOUNTER — Other Ambulatory Visit: Payer: MEDICAID

## 2023-02-22 ENCOUNTER — Encounter: Payer: Self-pay | Admitting: Physician Assistant

## 2023-02-22 DIAGNOSIS — R300 Dysuria: Secondary | ICD-10-CM

## 2023-02-22 LAB — WET PREP FOR TRICH, YEAST, CLUE
Clue Cell Exam: POSITIVE — AB
Trichomonas Exam: NEGATIVE
Yeast Exam: POSITIVE — AB

## 2023-02-22 LAB — URINALYSIS, ROUTINE W REFLEX MICROSCOPIC
Bilirubin, UA: NEGATIVE
Glucose, UA: NEGATIVE
Ketones, UA: NEGATIVE
Leukocytes,UA: NEGATIVE
Nitrite, UA: NEGATIVE
Protein,UA: NEGATIVE
RBC, UA: NEGATIVE
Specific Gravity, UA: >1.03 — ABNORMAL HIGH (ref 1.005–1.030)
Urobilinogen, Ur: 1 mg/dL (ref 0.2–1.0)
pH, UA: 6 (ref 5.0–7.5)

## 2023-02-22 MED ORDER — FLUCONAZOLE 150 MG PO TABS: 150.0000 mg | ORAL_TABLET | ORAL | 0 refills | Status: DC | PRN

## 2023-02-22 MED ORDER — METRONIDAZOLE 500 MG PO TABS
500.0000 mg | ORAL_TABLET | Freq: Two times a day (BID) | ORAL | 0 refills | Status: AC
Start: 2023-02-22 — End: ?

## 2023-02-22 NOTE — Progress Notes (Signed)
Your cervicovaginal swab was positive for bacterial vaginosis.  I have sent in a script for Flagyl to be taken by mouth twice per day for 7 days. Please finish the entire course unless you are instructed to stop or develop an allergic reaction. Please note that this medication can cause severe nausea and vomiting if alcohol is consumed while you are taking it so please refrain from this during the week you are on it. Please let us know if you have further questions or concerns.   Your swab was also positive for a yeast infection. I have sent in diflucan for you to take to clear this up. Let us know if you have questions or concerns.

## 2023-02-27 NOTE — Assessment & Plan Note (Signed)
Chronic, historic condition, ongoing Patient reports that she has had increasing dizziness and "head rushes", ear ringing and presyncope every time she stands up since Monday. She has been taking her metoprolol 12.5 mg p.o. daily as directed.  She reports that despite taking this her pulse has been staying high and she thinks that it has been higher than her normal as she is having palpitations  At this time I think it might be pertinent to try midodrine. Will start midodrine 2.5 mg p.o. 3 times daily with meals and recommend that she stops the metoprolol. Recommend that she continues with her other POTS management techniques such as increased water consumption, salt intake, physical therapy. We reviewed that she might have some rebound tachycardia with stopping the metoprolol and if she notices her heart rate staying elevated she can take a dose as needed Recommend follow-up in about 2 weeks to assess response to medication changes or sooner if concerns arise

## 2023-03-05 ENCOUNTER — Institutional Professional Consult (permissible substitution): Payer: Self-pay | Admitting: Internal Medicine

## 2023-03-12 ENCOUNTER — Other Ambulatory Visit: Payer: Self-pay | Admitting: Pediatrics

## 2023-03-12 DIAGNOSIS — R310 Gross hematuria: Secondary | ICD-10-CM

## 2023-03-12 DIAGNOSIS — N390 Urinary tract infection, site not specified: Secondary | ICD-10-CM

## 2023-03-15 ENCOUNTER — Ambulatory Visit
Admission: RE | Admit: 2023-03-15 | Discharge: 2023-03-15 | Disposition: A | Discharge status: Home / Self Care | Payer: MEDICAID | Source: Ambulatory Visit | Attending: Pediatrics | Admitting: Pediatrics

## 2023-03-15 DIAGNOSIS — N390 Urinary tract infection, site not specified: Secondary | ICD-10-CM | POA: Insufficient documentation

## 2023-03-15 DIAGNOSIS — R310 Gross hematuria: Secondary | ICD-10-CM | POA: Diagnosis present

## 2023-03-18 ENCOUNTER — Ambulatory Visit: Payer: MEDICAID

## 2023-03-22 ENCOUNTER — Telehealth (INDEPENDENT_AMBULATORY_CARE_PROVIDER_SITE_OTHER): Payer: MEDICAID | Admitting: Physician Assistant

## 2023-03-22 ENCOUNTER — Encounter: Payer: Self-pay | Admitting: Physician Assistant

## 2023-03-22 DIAGNOSIS — G90A Postural orthostatic tachycardia syndrome (POTS): Secondary | ICD-10-CM | POA: Diagnosis not present

## 2023-03-22 DIAGNOSIS — F3341 Major depressive disorder, recurrent, in partial remission: Secondary | ICD-10-CM

## 2023-03-22 MED ORDER — MIDODRINE HCL 2.5 MG PO TABS
2.5000 mg | ORAL_TABLET | Freq: Two times a day (BID) | ORAL | 0 refills | Status: DC
Start: 2023-03-22 — End: 2023-09-06

## 2023-03-22 NOTE — Progress Notes (Signed)
Virtual Visit via Video Note  I connected with Michelle Stokes on 03/22/23 at 11:00 AM EDT by a video enabled telemedicine application and verified that I am speaking with the correct person using two identifiers.  Today's Provider: Jacquelin Hawking, MHS, PA-C Introduced myself to the patient as a PA-C and provided education on APPs in clinical practice.   Location: Patient: at home Provider: [facility], [place], [place]    I discussed the limitations of evaluation and management by telemedicine and the availability of in person appointments. The patient expressed understanding and agreed to proceed.    Chief Complaint  Patient presents with   Medication Management    Patient says she started the medication at her last visit, she has symptoms of headaches, chest tightness when she wakes up and dizziness. Patient says it is hard to differentiate between the POTS diagnosis and the medication.     History of Present Illness:    POTS follow up   She was started on Midodrine at previous visit  She states she is having some headaches and when she stands up she reports feeling like her chest is tight  She reports the chest tightness lasts about 2 minutes  She is still having dizziness on standing, thinks it is getting better though. She doesn't feel like she is going to black out every time like before, just lightheaded  She thinks her HR is still on the rapid side. She thinks the Midodrine is helping with the dizziness, lightheadedness but not so much the HR fluctuations  She has been taking Midodrine 3 times per day and she has stopped the Metoprolol completely   She does report headaches- taking tylenol or ibuprofen seem to help with this  She has been checking BP at home - usually in 130s/80s even when she has headaches   She reports that these changes are causing side effects with anxiety and depression  She has tried numerous anxiety meds in the past   Review of  Systems  Cardiovascular:  Positive for chest pain and palpitations.  Neurological:  Positive for dizziness and headaches. Negative for loss of consciousness.     Outpatient Encounter Medications as of 03/22/2023  Medication Sig   acetaminophen (TYLENOL) 325 MG tablet Take 150 mg by mouth every 6 (six) hours as needed.   albuterol (PROVENTIL) (2.5 MG/3ML) 0.083% nebulizer solution Take 3 mLs (2.5 mg total) by nebulization every 6 (six) hours as needed for wheezing or shortness of breath.   Albuterol-Budesonide 90-80 MCG/ACT AERO Inhale 1 Inhaler into the lungs every 6 (six) hours as needed.   Cholecalciferol 1.25 MG (50000 UT) TABS Take 1 tablet by mouth 2 (two) times a week. For 12 weeks and then stop.  Return to office for lab draw.   fluconazole (DIFLUCAN) 150 MG tablet Take 1 tablet (150 mg total) by mouth every three (3) days as needed. May repeat in 3 days if symptoms not resolved   lubiprostone (AMITIZA) 24 MCG capsule Take 24 mcg by mouth 2 (two) times daily.   meclizine (ANTIVERT) 25 MG tablet Take 1 tablet (25 mg total) by mouth 3 (three) times daily as needed for dizziness.   ondansetron (ZOFRAN) 4 MG tablet Take 1 tablet (4 mg total) by mouth every 8 (eight) hours as needed for nausea or vomiting.   ondansetron (ZOFRAN-ODT) 4 MG disintegrating tablet Take 1 tablet (4 mg total) by mouth every 8 (eight) hours as needed for nausea or vomiting.  pantoprazole (PROTONIX) 40 MG tablet Take 40 mg by mouth daily.   [DISCONTINUED] metoprolol succinate (TOPROL-XL) 25 MG 24 hr tablet Take 0.5 tablets (12.5 mg total) by mouth daily.   [DISCONTINUED] metroNIDAZOLE (FLAGYL) 500 MG tablet Take 1 tablet (500 mg total) by mouth 2 (two) times daily.   [DISCONTINUED] midodrine (PROAMATINE) 2.5 MG tablet Take 1 tablet (2.5 mg total) by mouth 3 (three) times daily with meals.   midodrine (PROAMATINE) 2.5 MG tablet Take 1 tablet (2.5 mg total) by mouth 2 (two) times daily with a meal.   No  facility-administered encounter medications on file as of 03/22/2023.    Observations/Objective:  Due to the nature of the virtual visit, physical exam and observations are limited. Able to obtain the following observations:   Alert, oriented, x3 Appears comfortable, in no acute distress.  No scleral injection, no appreciated hoarseness, tachypnea, wheeze or strider. Able to maintain conversation without visible strain.  No cough appreciated during visit.    Assessment and Plan:  Problem List Items Addressed This Visit       Cardiovascular and Mediastinum   POTS (postural orthostatic tachycardia syndrome) - Primary    Chronic, ongoing  She reports some improvement in ambulatory dizziness and head rushes since starting Midodrine 2.5 mg PO TID  She does admit to having chest tightness on standing and headaches  She has checked BP at home and this has been around 130s/80s even during headache episodes Will try decreasing Midodrine to BID for now to see if this helps with potential side effects. Recommend she continues with Tylenol and Ibuprofen for headaches She still has upcoming apt in Dec with [facility] and we are both hesitant to make too many changes on medications prior to this apt.  Recommend follow up in about 3-4 months to discuss POTS clinic changes and potentially address lingering mood concerns      Relevant Medications   midodrine (PROAMATINE) 2.5 MG tablet     Other   Recurrent major depressive disorder, in partial remission (HCC)    Chronic, exacerbated a bit She reports difficulties with POTS dx and management have been leading to stress and anxiety since she is not able to go about daily activities as desired She would like to wait to address this unless symptoms become more severe since she has apt with [facility] in Dec and is hopeful that management regimen with them will provide more relief Recommend follow up as needed with [place] prior to Dec and  can reassess in Jan if needed.       Follow Up Instructions:    I discussed the assessment and treatment plan with the patient. The patient was provided an opportunity to ask questions and all were answered. The patient agreed with the plan and demonstrated an understanding of the instructions.   The patient was advised to call back or seek an in-person evaluation if the symptoms worsen or if the condition fails to improve as anticipated.  I provided 15 minutes of non-face-to-face time during this encounter.  Return in about 4 months (around 07/22/2023) for POTS follow up, mood .   I, Temperence Zenor E Denis Carreon, PA-C, have reviewed all documentation for this visit. The documentation on 03/22/23 for the exam, diagnosis, procedures, and orders are all accurate and complete.   Jacquelin Hawking, MHS, PA-C [facility]

## 2023-03-22 NOTE — Assessment & Plan Note (Signed)
Chronic, ongoing  She reports some improvement in ambulatory dizziness and head rushes since starting Midodrine 2.5 mg PO TID  She does admit to having chest tightness on standing and headaches  She has checked BP at home and this has been around 130s/80s even during headache episodes Will try decreasing Midodrine to BID for now to see if this helps with potential side effects. Recommend she continues with Tylenol and Ibuprofen for headaches She still has upcoming apt in Dec with [facility] and we are both hesitant to make too many changes on medications prior to this apt.  Recommend follow up in about 3-4 months to discuss POTS clinic changes and potentially address lingering mood concerns

## 2023-03-22 NOTE — Assessment & Plan Note (Signed)
Chronic, exacerbated a bit She reports difficulties with POTS dx and management have been leading to stress and anxiety since she is not able to go about daily activities as desired She would like to wait to address this unless symptoms become more severe since she has apt with [facility] in Dec and is hopeful that management regimen with them will provide more relief Recommend follow up as needed with [place] prior to Dec and can reassess in Jan if needed.

## 2023-04-18 ENCOUNTER — Encounter: Payer: Self-pay | Admitting: Nurse Practitioner

## 2023-04-19 ENCOUNTER — Telehealth: Payer: Self-pay | Admitting: Nurse Practitioner

## 2023-04-19 NOTE — Telephone Encounter (Signed)
Copied from CRM [id number]. Topic: General - Other >> Apr 19, 2023 10:15 AM Macon Large wrote: Reason for CRM: Pt stated that she sent her provider a message in [facility] regarding paperwork for nursing school but she has not received a response. Pt requests call back to advise if she can just drop the paperwork off to be signed or if an appt is needed. Cb# [phone]

## 2023-04-19 NOTE — Telephone Encounter (Signed)
See my chart message

## 2023-05-02 ENCOUNTER — Ambulatory Visit: Payer: MEDICAID | Admitting: Nurse Practitioner

## 2023-05-09 ENCOUNTER — Ambulatory Visit (INDEPENDENT_AMBULATORY_CARE_PROVIDER_SITE_OTHER): Payer: MEDICAID | Admitting: Nurse Practitioner

## 2023-05-09 ENCOUNTER — Encounter: Payer: Self-pay | Admitting: Nurse Practitioner

## 2023-05-09 VITALS — BP 101/68 | HR 83 | Temp 98.9°F | Ht 67.75 in | Wt 178.0 lb

## 2023-05-09 DIAGNOSIS — Z23 Encounter for immunization: Secondary | ICD-10-CM | POA: Diagnosis not present

## 2023-05-09 DIAGNOSIS — Z021 Encounter for pre-employment examination: Secondary | ICD-10-CM

## 2023-05-09 NOTE — Progress Notes (Signed)
BP 101/68 (BP Location: Left Arm, Patient Position: Sitting, Cuff Size: Normal)   Pulse 83   Temp 98.9 F (37.2 C) (Oral)   Ht 5' 7.75" (1.721 m)   Wt 178 lb (80.7 kg)   SpO2 96%   BMI 27.26 kg/m    Subjective:    Patient ID: Michelle Stokes, female    DOB: [birth year], 17 y.o.   MRN: [id number]  HPI: Michelle Stokes is a 17 y.o. 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  Denies concerns at visit today.  Understands she needs a flu, titers, and exam for school.   Relevant past medical, surgical, family and social history reviewed and updated as indicated. Interim medical history since our last visit reviewed. Allergies and medications reviewed and updated.  Review of Systems  All other systems reviewed and are negative.   Per HPI unless specifically indicated above     Objective:    BP 101/68 (BP Location: Left Arm, Patient Position: Sitting, Cuff Size: Normal)   Pulse 83   Temp 98.9 F (37.2 C) (Oral)   Ht 5' 7.75" (1.721 m)   Wt 178 lb (80.7 kg)   SpO2 96%   BMI 27.26 kg/m   Wt Readings from Last 3 Encounters:  05/09/23 178 lb (80.7 kg) (94%, Z= 1.57)*  02/05/23 179 lb (81.2 kg) (95%, Z= 1.60)*  01/07/23 179 lb 12.8 oz (81.6 kg) (95%, Z= 1.62)*   * Growth percentiles are based on CDC (Girls, 2-20 Years) data.    Physical Exam Vitals and nursing note reviewed.  Constitutional:      General: She is awake. She is not in acute distress.    Appearance: Normal appearance. She is well-developed and normal weight. She is not ill-appearing, toxic-appearing or diaphoretic.  HENT:     Head: Normocephalic and atraumatic.     Right Ear: Hearing, tympanic membrane, ear canal and external ear normal. No drainage.     Left Ear: Hearing, tympanic membrane, ear canal and  external ear normal. No drainage.     Nose: Nose normal.     Right Sinus: No maxillary sinus tenderness or frontal sinus tenderness.     Left Sinus: No maxillary sinus tenderness or frontal sinus tenderness.     Mouth/Throat:     Mouth: Mucous membranes are moist.     Pharynx: Oropharynx is clear. Uvula midline. No pharyngeal swelling, oropharyngeal exudate or posterior oropharyngeal erythema.  Eyes:     General: Lids are normal.        Right eye: No discharge.        Left eye: No discharge.     Extraocular Movements: Extraocular movements intact.     Conjunctiva/sclera: Conjunctivae normal.     Pupils: Pupils are equal, round, and reactive to light.     Visual Fields: Right eye visual fields normal and left eye visual fields normal.  Neck:     Thyroid: No thyromegaly.     Vascular: No carotid bruit.     Trachea: Trachea normal.  Cardiovascular:     Rate and Rhythm: Normal rate and regular rhythm.     Heart sounds: Normal heart sounds. No murmur heard.    No gallop.  Pulmonary:     Effort: Pulmonary effort is  normal. No accessory muscle usage or respiratory distress.     Breath sounds: Normal breath sounds. No wheezing or rales.  Chest:  Breasts:    Right: Normal.     Left: Normal.  Abdominal:     General: Bowel sounds are normal.     Palpations: Abdomen is soft. There is no hepatomegaly or splenomegaly.     Tenderness: There is no abdominal tenderness.  Musculoskeletal:        General: Normal range of motion.     Cervical back: Normal range of motion and neck supple.     Right lower leg: No edema.     Left lower leg: No edema.  Lymphadenopathy:     Head:     Right side of head: No submental, submandibular, tonsillar, preauricular or posterior auricular adenopathy.     Left side of head: No submental, submandibular, tonsillar, preauricular or posterior auricular adenopathy.     Cervical: No cervical adenopathy.     Upper Body:     Right upper body: No supraclavicular,  axillary or pectoral adenopathy.     Left upper body: No supraclavicular, axillary or pectoral adenopathy.  Skin:    General: Skin is warm and dry.     Capillary Refill: Capillary refill takes less than 2 seconds.     Findings: No rash.  Neurological:     General: No focal deficit present.     Mental Status: She is alert and oriented to person, place, and time. Mental status is at baseline.     Gait: Gait is intact.  Psychiatric:        Attention and Perception: Attention normal.        Mood and Affect: Mood normal.        Speech: Speech normal.        Behavior: Behavior normal. Behavior is cooperative.        Thought Content: Thought content normal.        Judgment: Judgment normal.     Results for orders placed or performed in visit on 02/22/23  WET PREP FOR TRICH, YEAST, CLUE   Specimen: Sterile Swab   Sterile Swab  Result Value Ref Range   Trichomonas Exam Negative Negative   Yeast Exam Positive (A) Negative   Clue Cell Exam Positive (A) Negative  Urinalysis, Routine w reflex microscopic  Result Value Ref Range   Specific Gravity, UA >1.030 (H) 1.005 - 1.030   pH, UA 6.0 5.0 - 7.5   Color, UA Yellow Yellow   Appearance Ur Cloudy (A) Clear   Leukocytes,UA Negative Negative   Protein,UA Negative Negative/Trace   Glucose, UA Negative Negative   Ketones, UA Negative Negative   RBC, UA Negative Negative   Bilirubin, UA Negative Negative   Urobilinogen, Ur 1.0 0.2 - 1.0 mg/dL   Nitrite, UA Negative Negative   Microscopic Examination Comment       Assessment & Plan:   Problem List Items Addressed This Visit   None Visit Diagnoses     Physical exam, pre-employment    -  Primary   Form completed for patient during visit.  Titers drawn, TB test checked.  Needs updated TDAP and Flu shot.   Relevant Orders   Measles/Mumps/Rubella Immunity   Hepatitis B surface antibody,quantitative   Varicella zoster antibody, IgG   QuantiFERON-TB Gold Plus        Follow up  plan: No follow-ups on file.

## 2023-05-12 ENCOUNTER — Emergency Department
Admission: EM | Admit: 2023-05-12 | Discharge: 2023-05-12 | Disposition: A | Discharge status: Home / Self Care | Payer: MEDICAID | Attending: Emergency Medicine | Admitting: Emergency Medicine

## 2023-05-12 ENCOUNTER — Other Ambulatory Visit: Payer: Self-pay

## 2023-05-12 DIAGNOSIS — R42 Dizziness and giddiness: Secondary | ICD-10-CM | POA: Insufficient documentation

## 2023-05-12 DIAGNOSIS — J45909 Unspecified asthma, uncomplicated: Secondary | ICD-10-CM | POA: Insufficient documentation

## 2023-05-12 DIAGNOSIS — R110 Nausea: Secondary | ICD-10-CM | POA: Diagnosis not present

## 2023-05-12 LAB — CBC WITH DIFFERENTIAL/PLATELET
Abs Immature Granulocytes: 0.02 10*3/uL (ref 0.00–0.07)
Basophils Absolute: 0.1 10*3/uL (ref 0.0–0.1)
Basophils Relative: 1 %
Eosinophils Absolute: 0.1 10*3/uL (ref 0.0–0.5)
Eosinophils Relative: 1 %
HCT: 40.9 % (ref 36.0–46.0)
Hemoglobin: 13.6 g/dL (ref 12.0–15.0)
Immature Granulocytes: 0 %
Lymphocytes Relative: 36 %
Lymphs Abs: 2.5 10*3/uL (ref 0.7–4.0)
MCH: 29.4 pg (ref 26.0–34.0)
MCHC: 33.3 g/dL (ref 30.0–36.0)
MCV: 88.3 fL (ref 80.0–100.0)
Monocytes Absolute: 0.8 10*3/uL (ref 0.1–1.0)
Monocytes Relative: 11 %
Neutro Abs: 3.6 10*3/uL (ref 1.7–7.7)
Neutrophils Relative %: 51 %
Platelets: 204 10*3/uL (ref 150–400)
RBC: 4.63 MIL/uL (ref 3.87–5.11)
RDW: 12.9 % (ref 11.5–15.5)
WBC: 7.1 10*3/uL (ref 4.0–10.5)
nRBC: 0 % (ref 0.0–0.2)

## 2023-05-12 LAB — BASIC METABOLIC PANEL
Anion gap: 10 (ref 5–15)
BUN: 17 mg/dL (ref 6–20)
CO2: 27 mmol/L (ref 22–32)
Calcium: 9.1 mg/dL (ref 8.9–10.3)
Chloride: 102 mmol/L (ref 98–111)
Creatinine, Ser: 0.72 mg/dL (ref 0.44–1.00)
GFR, Estimated: >60 mL/min (ref >60)
Glucose, Bld: 96 mg/dL (ref 70–99)
Potassium: 3.4 mmol/L — ABNORMAL LOW (ref 3.5–5.1)
Sodium: 139 mmol/L (ref 135–145)

## 2023-05-12 LAB — POC URINE PREG, ED: Preg Test, Ur: NEGATIVE

## 2023-05-12 MED ORDER — ONDANSETRON HCL 4 MG/2ML IJ SOLN
4.0000 mg | Freq: Once | INTRAMUSCULAR | Status: AC
  Administered 2023-05-12: 4 mg via INTRAVENOUS
  Filled 2023-05-12: qty 2

## 2023-05-12 MED ORDER — SODIUM CHLORIDE 0.9 % IV BOLUS
1000.0000 mL | Freq: Once | INTRAVENOUS | Status: AC
  Administered 2023-05-12: 1000 mL via INTRAVENOUS

## 2023-05-12 NOTE — ED Provider Notes (Signed)
[facility] Provider Note    Event Date/Time   First MD Initiated Contact with Patient 05/12/23 1508     (approximate)   History   Chief Complaint Dizziness   HPI  LIVY MULKA is a 17 y.o. female with past medical history of chronic pain syndrome, POTS, asthma, and migraines who presents to the ED complaining of dizziness.  Patient reports that she has been feeling dizzy and lightheaded for the past 2 days after receiving rectal injection of Botox at [facility] for constipation 2 days ago.  She states that she has been feeling nauseous since the procedure with decreased oral intake, denies any chest pain, abdominal pain, dysuria, shortness of breath, vomiting, or diarrhea.  She thinks that she could be dehydrated due to decreased oral intake.  She denies any areas of pain other than around where she had her injection.     Physical Exam   Triage Vital Signs: ED Triage Vitals  Encounter Vitals Group     BP 05/12/23 1449 117/67     Systolic BP Percentile --      Diastolic BP Percentile --      Pulse Rate 05/12/23 1449 78     Resp 05/12/23 1449 16     Temp 05/12/23 1449 98.2 F (36.8 C)     Temp Source 05/12/23 1449 Oral     SpO2 05/12/23 1449 100 %     Weight 05/12/23 1453 177 lb (80.3 kg)     Height 05/12/23 1453 5\' 7"  (1.702 m)     Head Circumference --      Peak Flow --      Pain Score 05/12/23 1452 5     Pain Loc --      Pain Education --      Exclude from Growth Chart --     Most recent vital signs: Vitals:   05/12/23 1449  BP: 117/67  Pulse: 78  Resp: 16  Temp: 98.2 F (36.8 C)  SpO2: 100%    Constitutional: Alert and oriented. Eyes: Conjunctivae are normal. Head: Atraumatic. Nose: No congestion/rhinnorhea. Mouth/Throat: Mucous membranes are moist.  Cardiovascular: Normal rate, regular rhythm. Grossly normal heart sounds.  2+ radial pulses bilaterally. Respiratory: Normal respiratory effort.  No retractions. Lungs  CTAB. Gastrointestinal: Soft and nontender. No distention. Musculoskeletal: No lower extremity tenderness nor edema.  Neurologic:  Normal speech and language. No gross focal neurologic deficits are appreciated.    ED Results / Procedures / Treatments   Labs (all labs ordered are listed, but only abnormal results are displayed) Labs Reviewed  BASIC METABOLIC PANEL - Abnormal; Notable for the following components:      Result Value   Potassium 3.4 (*)    All other components within normal limits  CBC WITH DIFFERENTIAL/PLATELET  POC URINE PREG, ED     EKG  ED ECG REPORT I, Chesley Noon, the attending physician, personally viewed and interpreted this ECG.   Date: 05/12/2023  EKG Time: 16:25  Rate: 57  Rhythm: sinus bradycardia  Axis: RAD  Intervals:none  ST&T Change: None  PROCEDURES:  Critical Care performed: No  Procedures   MEDICATIONS ORDERED IN ED: Medications  sodium chloride 0.9 % bolus 1,000 mL (1,000 mLs Intravenous New Bag/Given 05/12/23 1541)  ondansetron (ZOFRAN) injection 4 mg (4 mg Intravenous Given 05/12/23 1542)     IMPRESSION / MDM / ASSESSMENT AND PLAN / ED COURSE  I reviewed the triage vital signs and the nursing notes.  17 y.o. female with past medical history of chronic pain syndrome, POTS, asthma, and migraines who presents to the ED complaining of dizziness and lightheadedness 2 days after receiving rectal Botox injection.  Patient's presentation is most consistent with acute presentation with potential threat to life or bodily function.  Differential diagnosis includes, but is not limited to, arrhythmia, anemia, electrolyte abnormality, AKI, dehydration, orthostatic hypotension.  Patient well-appearing and in no acute distress, vital signs are unremarkable.  We will screen EKG and labs as well as pregnancy testing.  Suspect dehydration given patient's recent decreased oral intake following her procedure.  We  will treat symptomatically with IV Zofran and hydrate with IV fluids.  Patient reports feeling better following Zofran and IV fluids, tolerating oral intake without difficulty.  Labs are reassuring with no significant anemia, leukocytosis, electrolyte abnormality, or AKI.  Pregnancy testing is negative and patient is appropriate for discharge home with outpatient follow-up, was counseled to return to the ED for new or worsening symptoms.  Patient agrees with plan.      FINAL CLINICAL IMPRESSION(S) / ED DIAGNOSES   Final diagnoses:  Dizziness     Rx / DC Orders   ED Discharge Orders     None        Note:  This document was prepared using Dragon voice recognition software and may include unintentional dictation errors.   Chesley Noon, MD 05/12/23 [id number]

## 2023-05-12 NOTE — ED Triage Notes (Signed)
Patient states she received a rectal Botox injection on Friday and has been experiencing dizziness and fatigue since. Patient has had the procedure before and these symptoms are out of the ordinary. Patient was sent by [facility] urgent care for labs.

## 2023-05-13 ENCOUNTER — Ambulatory Visit: Payer: MEDICAID

## 2023-05-13 LAB — MEASLES/MUMPS/RUBELLA IMMUNITY
MUMPS ABS, IGG: <9 [AU]/ml — ABNORMAL LOW (ref >10.9)
RUBEOLA AB, IGG: 92.4 [AU]/ml (ref >16.4)
Rubella Antibodies, IGG: 2.03 {index} (ref >0.99)

## 2023-05-13 LAB — QUANTIFERON-TB GOLD PLUS
QuantiFERON Mitogen Value: >10 [IU]/mL
QuantiFERON Nil Value: 0 [IU]/mL
QuantiFERON TB1 Ag Value: 0 [IU]/mL
QuantiFERON TB2 Ag Value: 0 [IU]/mL
QuantiFERON-TB Gold Plus: NEGATIVE

## 2023-05-13 LAB — VARICELLA ZOSTER ANTIBODY, IGG: Varicella zoster IgG: NONREACTIVE

## 2023-05-13 LAB — HEPATITIS B SURFACE ANTIBODY, QUANTITATIVE: Hepatitis B Surf Ab Quant: <3.5 m[IU]/mL — ABNORMAL LOW

## 2023-05-13 NOTE — Addendum Note (Signed)
Addended by: [name] on: 05/13/2023 08:12 AM   Modules accepted: Orders

## 2023-05-16 ENCOUNTER — Telehealth: Payer: Self-pay

## 2023-05-16 NOTE — Transitions of Care (Post Inpatient/ED Visit) (Signed)
05/16/2023  Name: Michelle Stokes MRN: [id number] DOB: 28-May-2006  Today's TOC FU Call Status: Today's TOC FU Call Status:: Successful TOC FU Call Completed TOC FU Call Complete Date: 05/16/23 Patient's Name and Date of Birth confirmed.  Transition Care Management Follow-up Telephone Call Date of Discharge: 05/12/23 Discharge Facility: [facility]) Type of Discharge: Emergency Department Reason for ED Visit: Other: How have you been since you were released from the hospital?: Better Any questions or concerns?: No  Items Reviewed: Did you receive and understand the discharge instructions provided?: No Medications obtained,verified, and reconciled?: Yes (Medications Reviewed) Any new allergies since your discharge?: No Dietary orders reviewed?: No Do you have support at home?: Yes People in Home: parent(s)  Medications Reviewed Today: Medications Reviewed Today     Reviewed by Pablo Ledger, CMA (Certified Medical Assistant) on 05/16/23 at 1503  Med List Status: <None>   Medication Order Taking? Sig Documenting Provider Last Dose Status Informant  acetaminophen (TYLENOL) 325 MG tablet [id number] Yes Take 150 mg by mouth every 6 (six) hours as needed. [provider] Taking Active Pharmacy Records, Multiple Informants, Self  albuterol (PROVENTIL) (2.5 MG/3ML) 0.083% nebulizer solution [id number] Yes Take 3 mLs (2.5 mg total) by nebulization every 6 (six) hours as needed for wheezing or shortness of breath. Marjie Skiff, NP Taking Active Pharmacy Records, Multiple Informants, Self  Albuterol-Budesonide 90-80 MCG/ACT AERO [id number] Yes Inhale 1 Inhaler into the lungs every 6 (six) hours as needed. Weber Cooks, NP Taking Active   Cholecalciferol 1.25 MG (50000 UT) TABS [id number] Yes Take 1 tablet by mouth 2 (two) times a week. For 12 weeks and then stop.  Return to office for lab draw. [name], NP Taking Active    fluconazole (DIFLUCAN) 150 MG tablet [id number] Yes Take 1 tablet (150 mg total) by mouth every three (3) days as needed. May repeat in 3 days if symptoms not resolved Mecum, Erin E, PA-C Taking Active   lubiprostone (AMITIZA) 24 MCG capsule [id number] Yes Take 24 mcg by mouth 2 (two) times daily. [provider] Taking Active   meclizine (ANTIVERT) 25 MG tablet [id number] Yes Take 1 tablet (25 mg total) by mouth 3 (three) times daily as needed for dizziness. Concha Se, MD Taking Active   midodrine (PROAMATINE) 2.5 MG tablet [id number] Yes Take 1 tablet (2.5 mg total) by mouth 2 (two) times daily with a meal. Mecum, Erin E, PA-C Taking Active   ondansetron (ZOFRAN) 4 MG tablet [id number] Yes Take 1 tablet (4 mg total) by mouth every 8 (eight) hours as needed for nausea or vomiting. Mecum, Oswaldo Conroy, PA-C Taking Active   ondansetron (ZOFRAN-ODT) 4 MG disintegrating tablet [id number] Yes Take 1 tablet (4 mg total) by mouth every 8 (eight) hours as needed for nausea or vomiting. Willy Eddy, MD Taking Active Pharmacy Records, Multiple Informants, Self  pantoprazole (PROTONIX) 40 MG tablet [id number] Yes Take 40 mg by mouth daily. [provider] Taking Active             Home Care and Equipment/Supplies: Were Home Health Services Ordered?: No Any new equipment or medical supplies ordered?: No  Functional Questionnaire: Do you need assistance with bathing/showering or dressing?: No Do you need assistance with meal preparation?: No Do you need assistance with eating?: No Do you have difficulty maintaining continence: No Do you need assistance with getting out of bed/getting out of a chair/moving?: No Do you have difficulty managing or taking your  medications?: No  Follow up appointments reviewed: PCP Follow-up appointment confirmed?: Yes Date of PCP follow-up appointment?: 05/17/23 Follow-up Provider: Prescott Gum, NP [facility] Follow-up appointment  confirmed?: No Do you need transportation to your follow-up appointment?: No Do you understand care options if your condition(s) worsen?: Yes-patient verbalized understanding    SIGNATURE: Wilhemena Durie, CMA

## 2023-05-17 ENCOUNTER — Ambulatory Visit: Payer: MEDICAID | Admitting: Family Medicine

## 2023-05-17 VITALS — BP 112/74 | HR 81 | Temp 98.2°F | Ht 67.72 in | Wt 176.8 lb

## 2023-05-17 DIAGNOSIS — R42 Dizziness and giddiness: Secondary | ICD-10-CM | POA: Diagnosis not present

## 2023-05-17 DIAGNOSIS — Z23 Encounter for immunization: Secondary | ICD-10-CM | POA: Diagnosis not present

## 2023-05-17 DIAGNOSIS — E876 Hypokalemia: Secondary | ICD-10-CM

## 2023-05-17 NOTE — Progress Notes (Signed)
BP 112/74   Pulse 81   Temp 98.2 F (36.8 C) (Oral)   Ht 5' 7.72" (1.72 m)   Wt 176 lb 12.8 oz (80.2 kg)   SpO2 99%   BMI 27.11 kg/m    Subjective:    Patient ID: Michelle Stokes, female    DOB: [birth year], 17 y.o.   MRN: [id number]  HPI: Michelle Stokes is a 17 y.o. female  Chief Complaint  Patient presents with   Hospitalization Follow-up    Dizziness   Immunizations    Needs hepatitis B, MMR, Influenza   Transition of Care Hospital Follow up.  Patient reports feeling better since hospitalization for dizziness. She has increased her water intake to 60 ounces daily and x1 Gatorade daily. Denies dizziness, admits to some light headedness with standing for long periods of time, and admits to resolving after sitting down. She denies nausea since her hospitalization, and has zofran available as needed. She admits to some shakiness remaining, as she has always dealt with this.   Hospital/Facility: [facility]  D/C Physician: Chesley Noon, MD D/C Date: 05/12/2023  Diagnoses on Discharge: Dizziness  Date of interactive Contact within 48 hours of discharge: 05/16/2023 Contact was through: phone  Date of 7 day or 14 day face-to-face visit:    within 7 days  Outpatient Encounter Medications as of 05/17/2023  Medication Sig   acetaminophen (TYLENOL) 325 MG tablet Take 150 mg by mouth every 6 (six) hours as needed.   albuterol (PROVENTIL) (2.5 MG/3ML) 0.083% nebulizer solution Take 3 mLs (2.5 mg total) by nebulization every 6 (six) hours as needed for wheezing or shortness of breath.   Albuterol-Budesonide 90-80 MCG/ACT AERO Inhale 1 Inhaler into the lungs every 6 (six) hours as needed.   Cholecalciferol 1.25 MG (50000 UT) TABS Take 1 tablet by mouth 2 (two) times a week. For 12 weeks and then stop.  Return to office for lab draw. (Patient not taking: Reported on 05/17/2023)   fluconazole (DIFLUCAN) 150 MG tablet Take 1 tablet (150 mg total) by mouth  every three (3) days as needed. May repeat in 3 days if symptoms not resolved (Patient not taking: Reported on 05/17/2023)   lubiprostone (AMITIZA) 24 MCG capsule Take 24 mcg by mouth 2 (two) times daily.   meclizine (ANTIVERT) 25 MG tablet Take 1 tablet (25 mg total) by mouth 3 (three) times daily as needed for dizziness.   midodrine (PROAMATINE) 2.5 MG tablet Take 1 tablet (2.5 mg total) by mouth 2 (two) times daily with a meal.   ondansetron (ZOFRAN) 4 MG tablet Take 1 tablet (4 mg total) by mouth every 8 (eight) hours as needed for nausea or vomiting.   ondansetron (ZOFRAN-ODT) 4 MG disintegrating tablet Take 1 tablet (4 mg total) by mouth every 8 (eight) hours as needed for nausea or vomiting.   pantoprazole (PROTONIX) 40 MG tablet Take 40 mg by mouth daily.   No facility-administered encounter medications on file as of 05/17/2023.    Diagnostic Tests Reviewed/Disposition: EKG; bradycardia (57 HR), BMP, CBC, POC urine,   Consults:None  Discharge Instructions: Encouraged hydration, follow up with PCP  Disease/illness Education: Education provided on Dizziness  Home Health/Community Services Discussions/Referrals: None   Establishment or re-establishment of referral orders for community resources:None   Discussion with other health care providers:N/A  Appointments Coordinated with: PCP 05/17/2023  Education for self-management, independent living, and ADLs:  None  Relevant past medical, surgical, family and social history reviewed and updated  as indicated. Interim medical history since our last visit reviewed. Allergies and medications reviewed and updated.  Review of Systems  Constitutional:  Negative for fever.  Respiratory: Negative.    Cardiovascular: Negative.   Neurological:  Positive for light-headedness (sitting to standing position). Negative for dizziness, syncope, weakness and numbness.    Per HPI unless specifically indicated above     Objective:    BP  112/74   Pulse 81   Temp 98.2 F (36.8 C) (Oral)   Ht 5' 7.72" (1.72 m)   Wt 176 lb 12.8 oz (80.2 kg)   SpO2 99%   BMI 27.11 kg/m   Wt Readings from Last 3 Encounters:  05/17/23 176 lb 12.8 oz (80.2 kg) (94%, Z= 1.54)*  05/12/23 177 lb (80.3 kg) (94%, Z= 1.55)*  05/09/23 178 lb (80.7 kg) (94%, Z= 1.57)*   * Growth percentiles are based on CDC (Girls, 2-20 Years) data.    Physical Exam Vitals and nursing note reviewed.  Constitutional:      General: She is awake. She is not in acute distress.    Appearance: Normal appearance. She is well-developed and well-groomed. She is not ill-appearing.  HENT:     Head: Normocephalic and atraumatic.     Right Ear: Hearing and external ear normal. No drainage.     Left Ear: Hearing and external ear normal. No drainage.     Nose: Nose normal.  Eyes:     General: Lids are normal.        Right eye: No discharge.        Left eye: No discharge.     Conjunctiva/sclera: Conjunctivae normal.  Cardiovascular:     Rate and Rhythm: Normal rate and regular rhythm.     Pulses:          Radial pulses are 2+ on the right side and 2+ on the left side.       Posterior tibial pulses are 2+ on the right side and 2+ on the left side.     Heart sounds: Normal heart sounds, S1 normal and S2 normal. No murmur heard.    No gallop.  Pulmonary:     Effort: Pulmonary effort is normal. No accessory muscle usage or respiratory distress.     Breath sounds: Normal breath sounds. No decreased breath sounds, wheezing, rhonchi or rales.  Musculoskeletal:        General: Normal range of motion.     Cervical back: Full passive range of motion without pain and normal range of motion.     Right lower leg: No edema.     Left lower leg: No edema.  Skin:    General: Skin is warm and dry.     Capillary Refill: Capillary refill takes less than 2 seconds.  Neurological:     Mental Status: She is alert and oriented to person, place, and time.     Cranial Nerves: No cranial  nerve deficit.     Sensory: Sensation is intact.     Motor: Motor function is intact.     Coordination: Coordination is intact.     Gait: Gait is intact.  Psychiatric:        Attention and Perception: Attention normal.        Mood and Affect: Mood normal.        Speech: Speech normal.        Behavior: Behavior normal. Behavior is cooperative.        Thought Content: Thought content normal.  Results for orders placed or performed during the hospital encounter of 05/12/23  CBC with Differential  Result Value Ref Range   WBC 7.1 4.0 - 10.5 K/uL   RBC 4.63 3.87 - 5.11 MIL/uL   Hemoglobin 13.6 12.0 - 15.0 g/dL   HCT 40.9 81.1 - 91.4 %   MCV 88.3 80.0 - 100.0 fL   MCH 29.4 26.0 - 34.0 pg   MCHC 33.3 30.0 - 36.0 g/dL   RDW 78.2 95.6 - 21.3 %   Platelets 204 150 - 400 K/uL   nRBC 0.0 0.0 - 0.2 %   Neutrophils Relative % 51 %   Neutro Abs 3.6 1.7 - 7.7 K/uL   Lymphocytes Relative 36 %   Lymphs Abs 2.5 0.7 - 4.0 K/uL   Monocytes Relative 11 %   Monocytes Absolute 0.8 0.1 - 1.0 K/uL   Eosinophils Relative 1 %   Eosinophils Absolute 0.1 0.0 - 0.5 K/uL   Basophils Relative 1 %   Basophils Absolute 0.1 0.0 - 0.1 K/uL   Immature Granulocytes 0 %   Abs Immature Granulocytes 0.02 0.00 - 0.07 K/uL  Basic metabolic panel  Result Value Ref Range   Sodium 139 135 - 145 mmol/L   Potassium 3.4 (L) 3.5 - 5.1 mmol/L   Chloride 102 98 - 111 mmol/L   CO2 27 22 - 32 mmol/L   Glucose, Bld 96 70 - 99 mg/dL   BUN 17 6 - 20 mg/dL   Creatinine, Ser 0.86 0.44 - 1.00 mg/dL   Calcium 9.1 8.9 - 57.8 mg/dL   GFR, Estimated >46 >96 mL/min   Anion gap 10 5 - 15  POC urine preg, ED  Result Value Ref Range   Preg Test, Ur NEGATIVE NEGATIVE      Assessment & Plan:   Problem List Items Addressed This Visit     Dizziness - Primary    Hospital follow up. Patient admits this has resolved and feeling better. Continue with hospital recommendations for this, call if concerns arise.       Need for  influenza vaccination    Future order placed for this. Return in 1-2 weeks. Instructed to take benadryl 30 minutes prior since hx of rash allergy.       Relevant Orders   Flu Vaccine Trivalent High Dose (Fluad)   Need for MMR vaccine    Future order placed for this. Return in 1 week.       Other Visit Diagnoses     Need for hepatitis B booster vaccination       Given today.   Relevant Orders   Heplisav-B (HepB-CPG) Vaccine (Completed)   Hypokalemia       Acute, stable.. Provided handout of foods high in potassium, patient agreeable to increase diet with foods high in potassium.        Follow up plan: Return if symptoms worsen or fail to improve.

## 2023-05-17 NOTE — Assessment & Plan Note (Signed)
Hospital follow up. Patient admits this has resolved and feeling better. Continue with hospital recommendations for this, call if concerns arise.

## 2023-05-17 NOTE — Assessment & Plan Note (Signed)
Future order placed for this. Return in 1-2 weeks. Instructed to take benadryl 30 minutes prior since hx of rash allergy.

## 2023-05-17 NOTE — Assessment & Plan Note (Signed)
Future order placed for this. Return in 1 week.

## 2023-05-22 ENCOUNTER — Ambulatory Visit (INDEPENDENT_AMBULATORY_CARE_PROVIDER_SITE_OTHER): Payer: MEDICAID

## 2023-05-22 DIAGNOSIS — Z23 Encounter for immunization: Secondary | ICD-10-CM | POA: Diagnosis not present

## 2023-05-22 NOTE — Progress Notes (Signed)
Patient arrived for a nurse only visit for the influenza vaccine. Pre-screening done and patient eligible for vaccination. 0.5 ml of Flulaval administered in the left deltoid. Patient tolerated well, no complications encountered. Patient advised to monitor for possible reactions given her previous history. Advised any symptoms that causing her breathing difficulties to report immediately to the ED.

## 2023-06-04 ENCOUNTER — Encounter: Payer: Self-pay | Admitting: Nurse Practitioner

## 2023-06-06 ENCOUNTER — Ambulatory Visit: Payer: MEDICAID

## 2023-06-07 ENCOUNTER — Ambulatory Visit (INDEPENDENT_AMBULATORY_CARE_PROVIDER_SITE_OTHER): Payer: MEDICAID

## 2023-06-07 DIAGNOSIS — Z23 Encounter for immunization: Secondary | ICD-10-CM

## 2023-06-10 ENCOUNTER — Telehealth: Payer: Self-pay | Admitting: Nurse Practitioner

## 2023-06-10 NOTE — Telephone Encounter (Signed)
Attempted to reach patient, LVM to call office back to find out exactly what the patient is needing regarding her vaccinations.  Put in CRM.

## 2023-06-10 NOTE — Telephone Encounter (Signed)
Appointment has been made

## 2023-06-10 NOTE — Telephone Encounter (Signed)
Pt is calling to report that the MMR titer came back with low immunity - Pt has already received one booster- School is needing another booster or titer.  Varicella showed non reactive low immunity. Needing a booster or titer  Please advise CB- [phone]

## 2023-06-10 NOTE — Telephone Encounter (Signed)
Okay to give both shots.

## 2023-06-10 NOTE — Telephone Encounter (Unsigned)
Copied from CRM [id number]. Topic: General - Other >> Jun 10, 2023 10:09 AM Michelle Stokes wrote: Reason for CRM: The patient would like to be contacted by a member of administrative staff when possible regarding continued discussions related to their vaccinations   The patient is currently in school for nursing and has been told that they may need additional testing and screening to determine their improved immunity   Please contact when possible

## 2023-06-11 ENCOUNTER — Ambulatory Visit: Payer: MEDICAID

## 2023-06-11 DIAGNOSIS — Z23 Encounter for immunization: Secondary | ICD-10-CM | POA: Diagnosis not present

## 2023-06-13 ENCOUNTER — Ambulatory Visit: Payer: MEDICAID

## 2023-06-14 ENCOUNTER — Encounter: Payer: Self-pay | Admitting: Nurse Practitioner

## 2023-09-06 ENCOUNTER — Other Ambulatory Visit: Payer: Self-pay

## 2023-09-06 DIAGNOSIS — G90A Postural orthostatic tachycardia syndrome (POTS): Secondary | ICD-10-CM

## 2023-09-09 MED ORDER — MIDODRINE HCL 2.5 MG PO TABS: 2.5000 mg | ORAL_TABLET | Freq: Two times a day (BID) | ORAL | 0 refills | Status: DC

## 2023-09-10 ENCOUNTER — Encounter: Payer: Self-pay | Admitting: Obstetrics and Gynecology

## 2023-09-12 ENCOUNTER — Other Ambulatory Visit: Payer: Self-pay | Admitting: Obstetrics and Gynecology

## 2023-09-12 DIAGNOSIS — N912 Amenorrhea, unspecified: Secondary | ICD-10-CM

## 2023-09-12 MED ORDER — MEDROXYPROGESTERONE ACETATE 10 MG PO TABS: 10.0000 mg | ORAL_TABLET | Freq: Every day | ORAL | 0 refills | Status: DC

## 2023-09-12 NOTE — Progress Notes (Signed)
 Rx RF provera for amenorrhea. No menses on it prior but depo still possibly in system.

## 2023-10-22 ENCOUNTER — Encounter: Payer: Self-pay | Admitting: Obstetrics and Gynecology

## 2023-11-04 IMAGING — CR DG ABDOMEN 1V
1 series · 2 of 2 positions shown · non-contrast
Comparison: 02/11/2019, 03/15/2021

CLINICAL DATA: Flank pain

EXAM:
ABDOMEN - 1 VIEW

[Series 1: dg abd 1 view · 0.14mm/px · 2 of 2 slices shown]
[im 1/2]
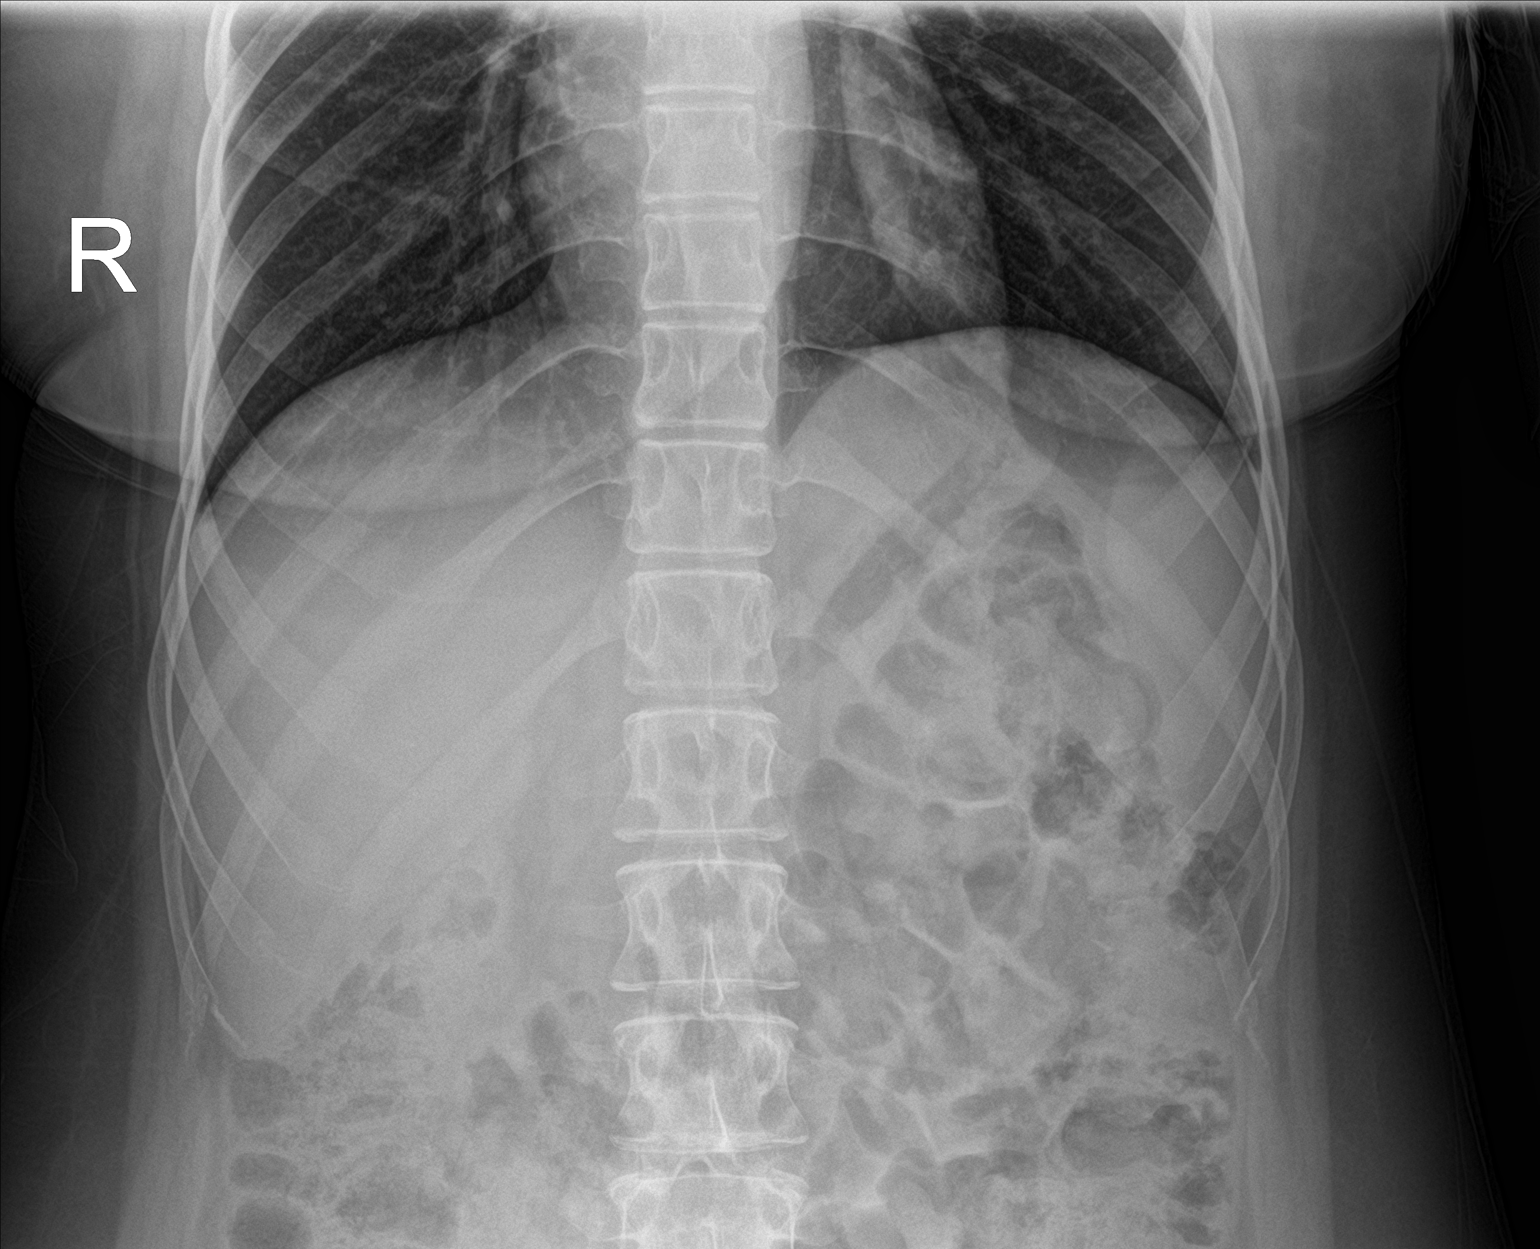
[im 2/2]
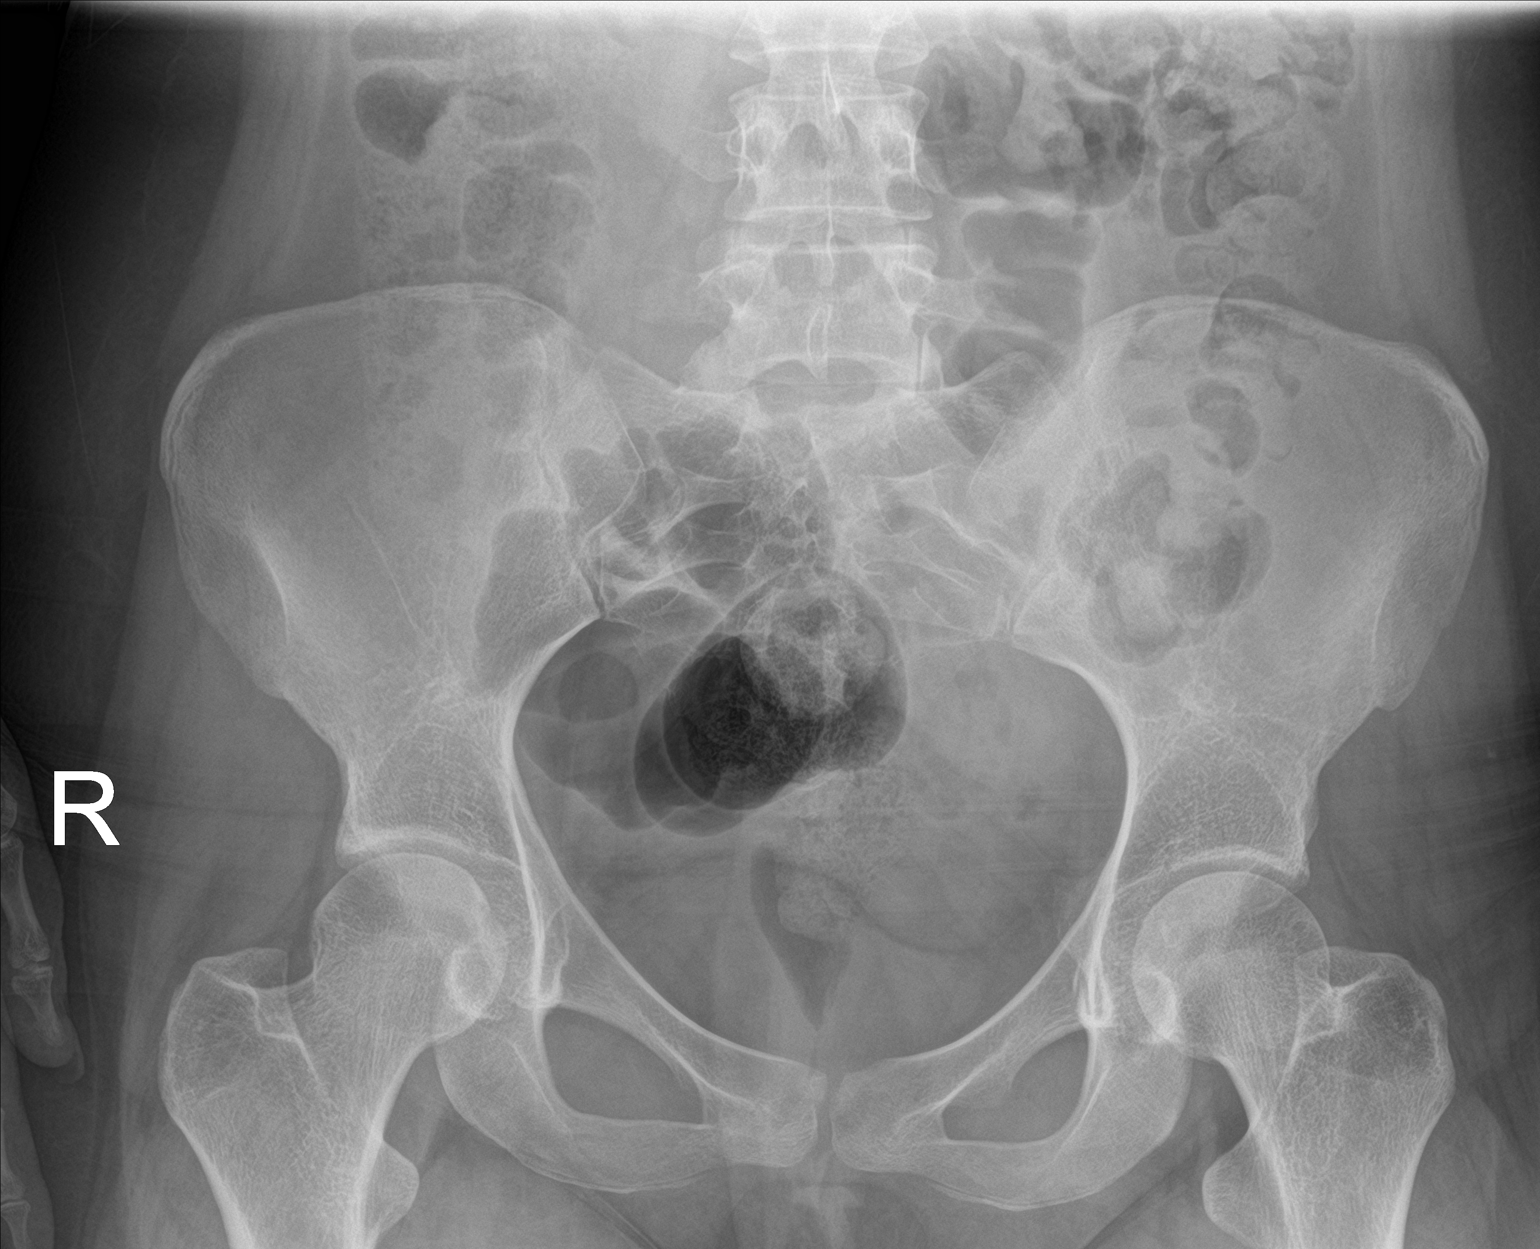

[2 of 2 positions shown; findings below may reference images not displayed]

FINDINGS: Two supine frontal views of the abdomen and pelvis are obtained.
Bowel gas pattern is unremarkable. No masses or abnormal
calcifications. Lung bases are clear. No acute bony abnormalities.
IMPRESSION: 1. Unremarkable exam.  No evidence of radiopaque calculi.

## 2023-11-04 IMAGING — US US RENAL
1 series · 14 of 25 positions shown · non-contrast
Comparison: CT abdomen pelvis 03/15/2021

CLINICAL DATA: Flank pain 1 day

EXAM:
RENAL / URINARY TRACT ULTRASOUND COMPLETE

[Series 1: us renal · 14 of 54 slices shown]
[im 1/54]
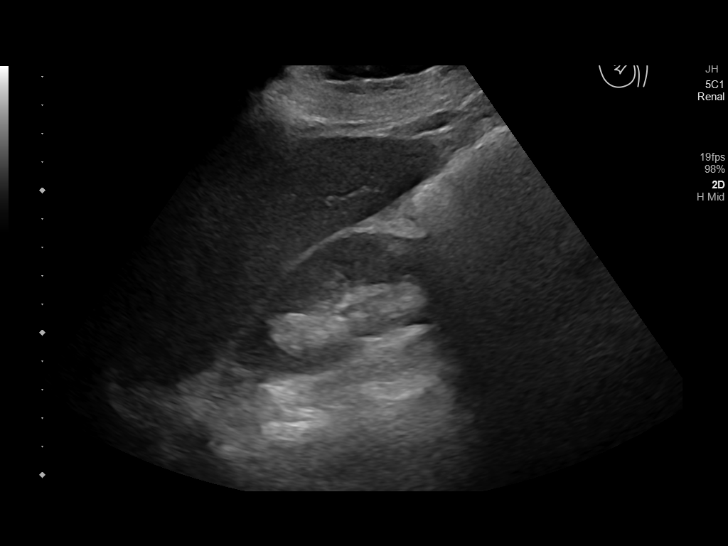
[im 5/54]
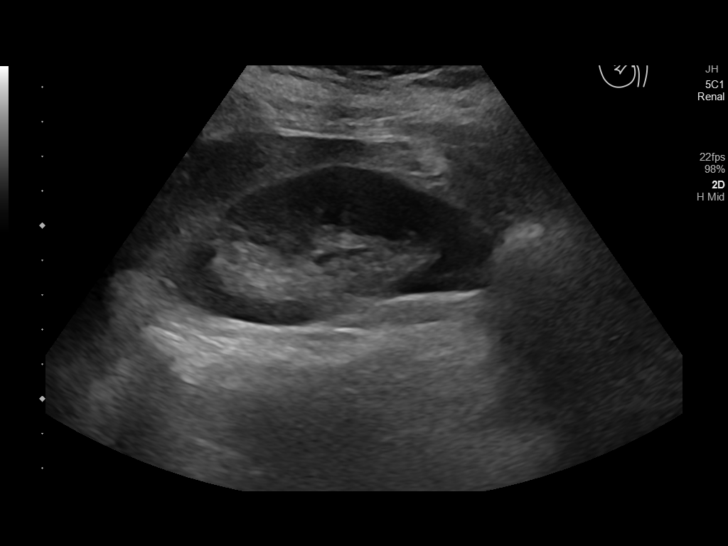
[im 9/54]
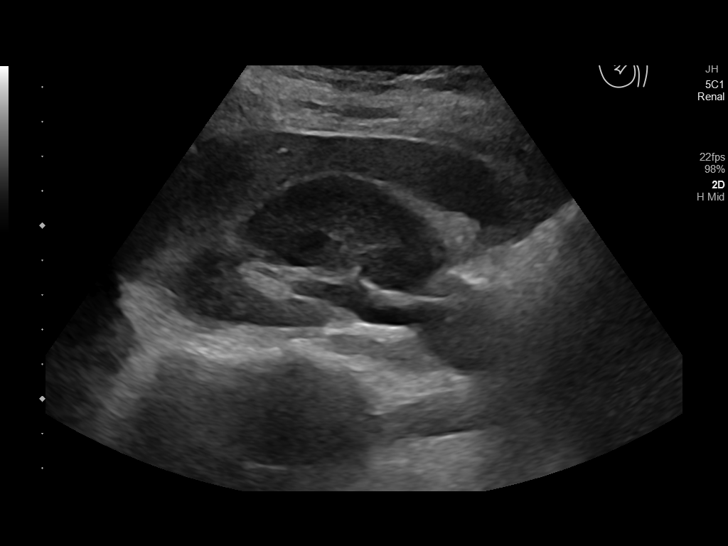
[im 14/54]
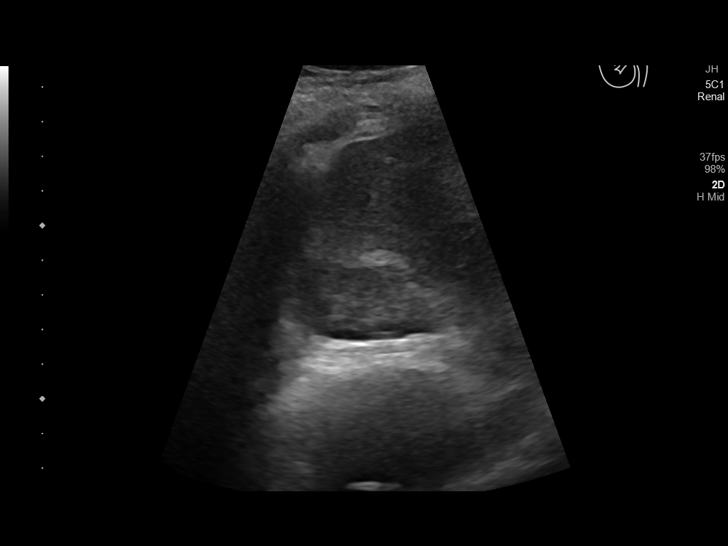
[im 18/54]
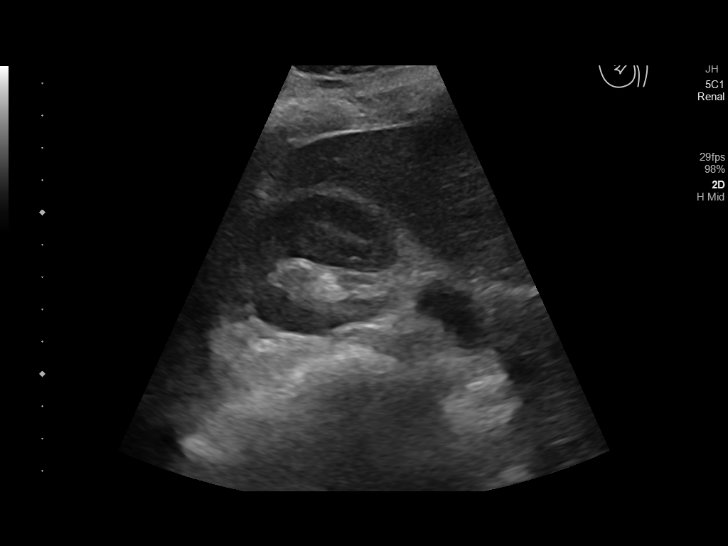
[im 20/54]
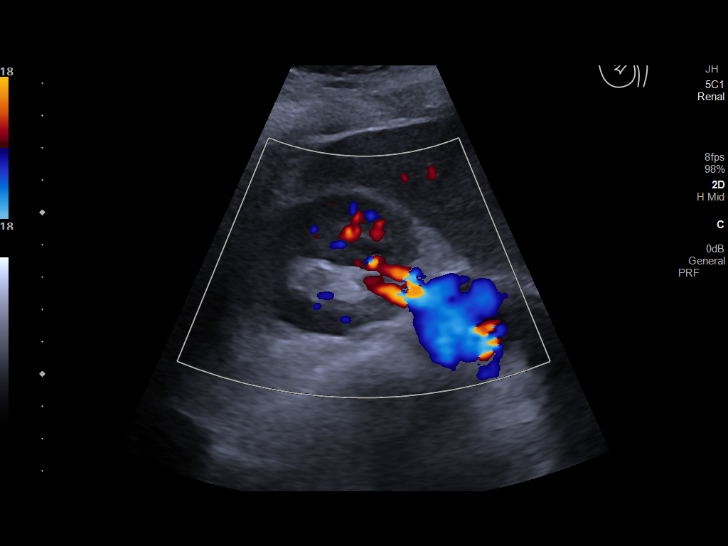
[im 25/54]
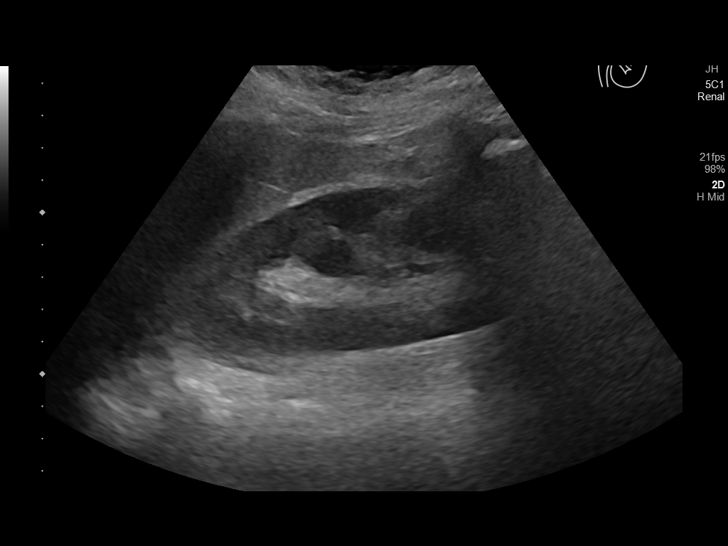
[im 29/54]
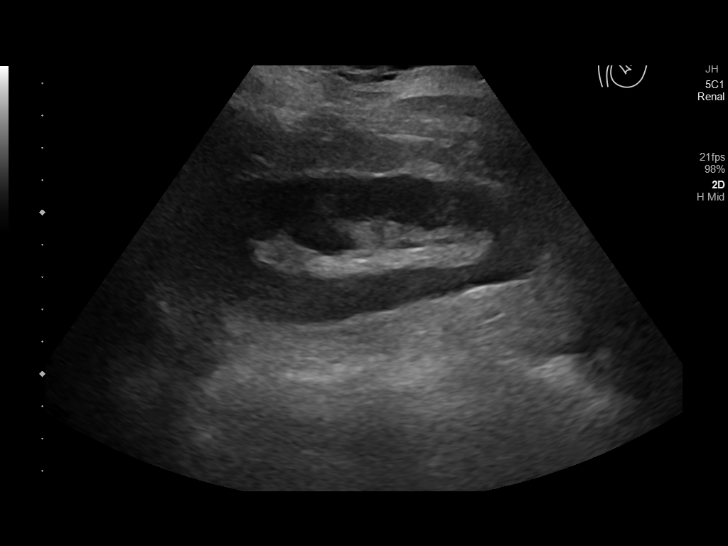
[im 34/54]
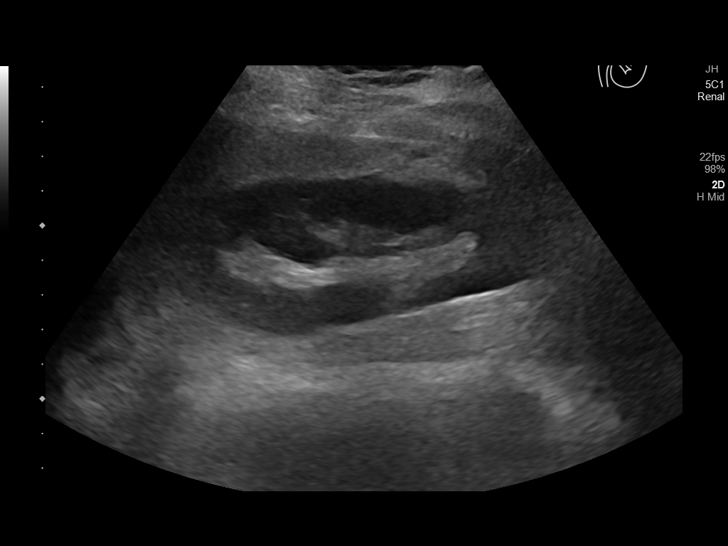
[im 36/54]
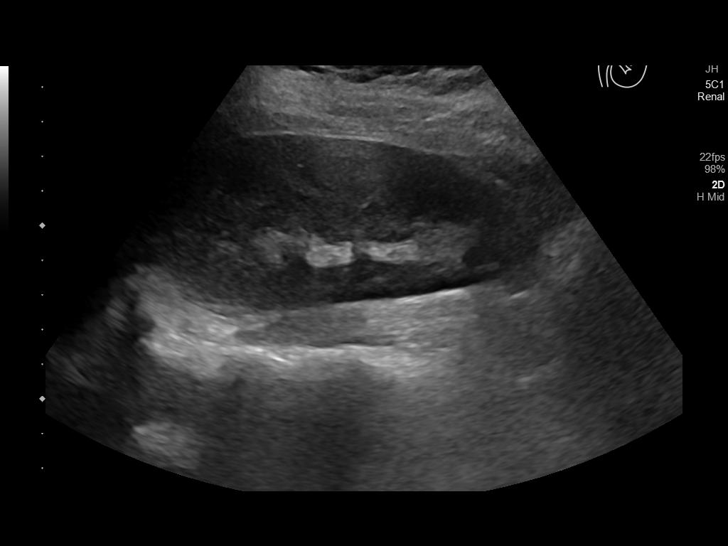
[im 40/54]
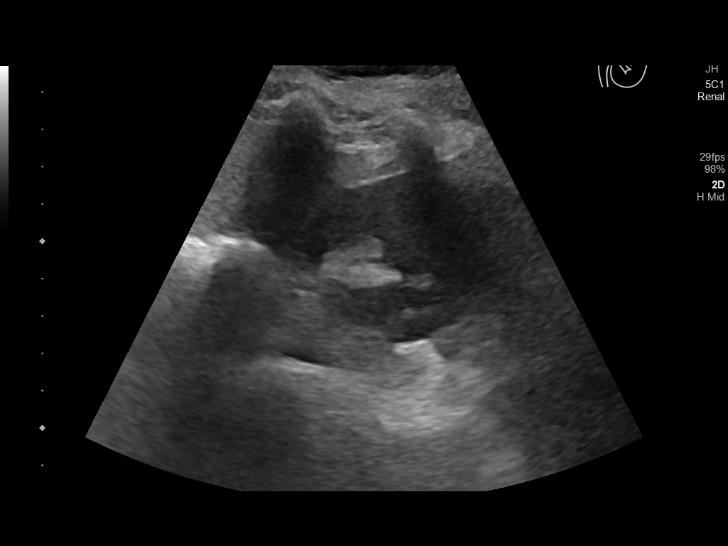
[im 45/54]
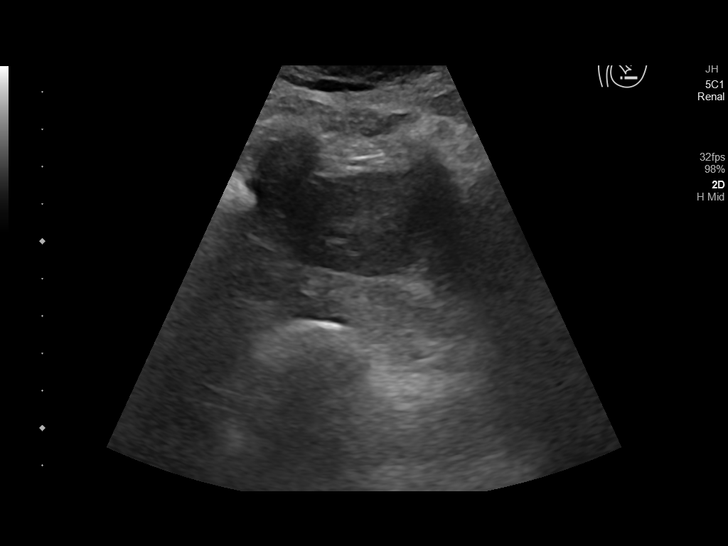
[im 49/54]
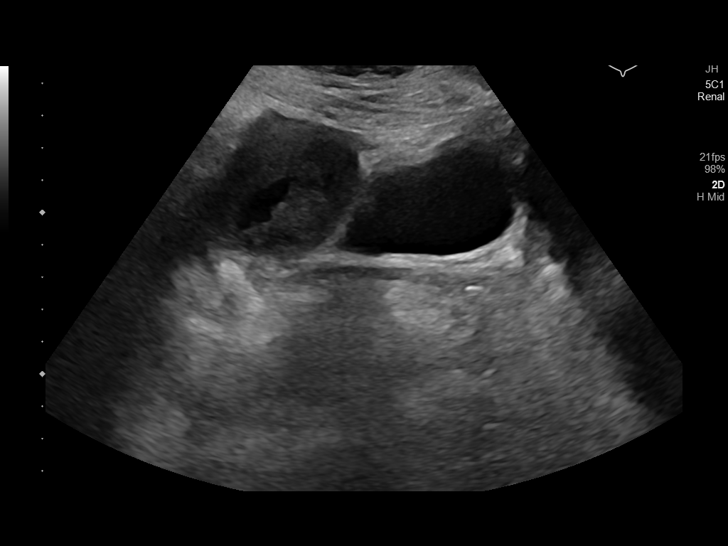
[im 54/54]
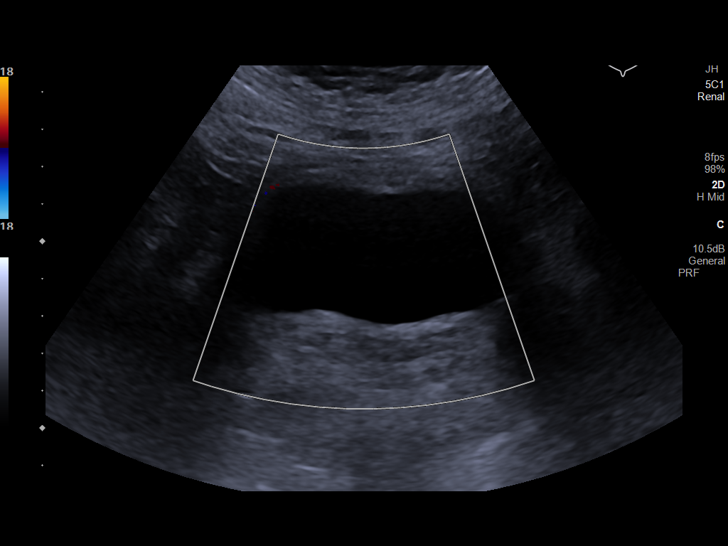

[14 of 25 positions shown; findings below may reference images not displayed]

FINDINGS: Right Kidney:

Renal measurements: 9.6 x 4.6 x 5.2 cm = volume: 120 mL.
Echogenicity within normal limits. No mass or hydronephrosis
visualized.

Left Kidney:

Renal measurements: 11.0 x 4.8 x 4.8 cm = volume: 131 mL.
Echogenicity within normal limits. No mass or hydronephrosis
visualized.

Bladder:

Appears normal for degree of bladder distention.

Other:

None.
IMPRESSION: Negative renal ultrasound

## 2023-11-06 ENCOUNTER — Ambulatory Visit: Payer: MEDICAID | Admitting: Nurse Practitioner

## 2023-11-06 ENCOUNTER — Encounter: Payer: Self-pay | Admitting: Nurse Practitioner

## 2023-11-06 VITALS — BP 107/69 | HR 83 | Temp 98.4°F | Resp 16 | Ht 66.1 in | Wt 165.8 lb

## 2023-11-06 DIAGNOSIS — L709 Acne, unspecified: Secondary | ICD-10-CM

## 2023-11-06 MED ORDER — CLINDAMYCIN PHOS (TWICE-DAILY) 1 % EX GEL: Freq: Two times a day (BID) | CUTANEOUS | 1 refills | Status: DC

## 2023-11-06 MED ORDER — BENZOYL PEROXIDE WASH 5 % EX LIQD: Freq: Two times a day (BID) | CUTANEOUS | 12 refills | Status: DC

## 2023-11-06 NOTE — Progress Notes (Signed)
 BP 107/69 (BP Location: Left Arm, Patient Position: Sitting, Cuff Size: Normal)   Pulse 83   Temp 98.4 F (36.9 C) (Oral)   Resp 16   Ht 5' 6.1" (1.679 m)   Wt 165 lb 12.8 oz (75.2 kg)   LMP  (LMP Unknown)   SpO2 98%   BMI 26.68 kg/m    Subjective:    Patient ID: Michelle Stokes, female    DOB: December 04, 2005, 18 y.o.   MRN: [id number]  HPI: Michelle Stokes is a 18 y.o. female  Chief Complaint  Patient presents with   Rash    Started a few months back and thought to be acne but has since gotten much redder and when it looks to be healing then bleeds. On chest and back. 3/10 tender to the touch. Nothing obvious for allergic reaction    RASH Duration:  chronic  Location: generalized  Itching: yes Burning: no Redness: yes Oozing: yes Scaling: no Blisters: no Painful: yes Fevers: no Change in detergents/soaps/personal care products: no Recent illness: no Recent travel:no History of same: no Context: worse Alleviating factors: body acne wash Treatments attempted:body acne wash Shortness of breath: no  Throat/tongue swelling: no Myalgias/arthralgias: no  Relevant past medical, surgical, family and social history reviewed and updated as indicated. Interim medical history since our last visit reviewed. Allergies and medications reviewed and updated.  Review of Systems  Skin:  Positive for rash.    Per HPI unless specifically indicated above     Objective:     BP 107/69 (BP Location: Left Arm, Patient Position: Sitting, Cuff Size: Normal)   Pulse 83   Temp 98.4 F (36.9 C) (Oral)   Resp 16   Ht 5' 6.1" (1.679 m)   Wt 165 lb 12.8 oz (75.2 kg)   LMP  (LMP Unknown)   SpO2 98%   BMI 26.68 kg/m   Wt Readings from Last 3 Encounters:  11/06/23 165 lb 12.8 oz (75.2 kg) (90%, Z= 1.28)*  05/17/23 176 lb 12.8 oz (80.2 kg) (94%, Z= 1.54)*  05/12/23 177 lb (80.3 kg) (94%, Z= 1.55)*   * Growth percentiles are based on CDC (Girls, 2-20 Years) data.    Physical  Exam Vitals and nursing note reviewed.  Constitutional:      General: She is not in acute distress.    Appearance: Normal appearance. She is normal weight. She is not ill-appearing, toxic-appearing or diaphoretic.  HENT:     Head: Normocephalic.     Right Ear: External ear normal.     Left Ear: External ear normal.     Nose: Nose normal.     Mouth/Throat:     Mouth: Mucous membranes are moist.     Pharynx: Oropharynx is clear.  Eyes:     General:        Right eye: No discharge.        Left eye: No discharge.     Extraocular Movements: Extraocular movements intact.     Conjunctiva/sclera: Conjunctivae normal.     Pupils: Pupils are equal, round, and reactive to light.  Cardiovascular:     Rate and Rhythm: Normal rate and regular rhythm.     Heart sounds: No murmur heard. Pulmonary:     Effort: Pulmonary effort is normal. No respiratory distress.     Breath sounds: Normal breath sounds. No wheezing or rales.  Musculoskeletal:     Cervical back: Normal range of motion and neck supple.  Skin:  General: Skin is warm and dry.     Capillary Refill: Capillary refill takes less than 2 seconds.     Findings: Rash present. Rash is crusting and papular.     Comments: acne  Neurological:     General: No focal deficit present.     Mental Status: She is alert and oriented to person, place, and time. Mental status is at baseline.  Psychiatric:        Mood and Affect: Mood normal.        Behavior: Behavior normal.        Thought Content: Thought content normal.        Judgment: Judgment normal.     Results for orders placed or performed during the hospital encounter of 05/12/23  CBC with Differential   Collection Time: 05/12/23  3:27 PM  Result Value Ref Range   WBC 7.1 4.0 - 10.5 K/uL   RBC 4.63 3.87 - 5.11 MIL/uL   Hemoglobin 13.6 12.0 - 15.0 g/dL   HCT 41.3 24.4 - 01.0 %   MCV 88.3 80.0 - 100.0 fL   MCH 29.4 26.0 - 34.0 pg   MCHC 33.3 30.0 - 36.0 g/dL   RDW 27.2 53.6 - 64.4  %   Platelets 204 150 - 400 K/uL   nRBC 0.0 0.0 - 0.2 %   Neutrophils Relative % 51 %   Neutro Abs 3.6 1.7 - 7.7 K/uL   Lymphocytes Relative 36 %   Lymphs Abs 2.5 0.7 - 4.0 K/uL   Monocytes Relative 11 %   Monocytes Absolute 0.8 0.1 - 1.0 K/uL   Eosinophils Relative 1 %   Eosinophils Absolute 0.1 0.0 - 0.5 K/uL   Basophils Relative 1 %   Basophils Absolute 0.1 0.0 - 0.1 K/uL   Immature Granulocytes 0 %   Abs Immature Granulocytes 0.02 0.00 - 0.07 K/uL  Basic metabolic panel   Collection Time: 05/12/23  3:27 PM  Result Value Ref Range   Sodium 139 135 - 145 mmol/L   Potassium 3.4 (L) 3.5 - 5.1 mmol/L   Chloride 102 98 - 111 mmol/L   CO2 27 22 - 32 mmol/L   Glucose, Bld 96 70 - 99 mg/dL   BUN 17 6 - 20 mg/dL   Creatinine, Ser 0.34 0.44 - 1.00 mg/dL   Calcium 9.1 8.9 - 74.2 mg/dL   GFR, Estimated >59 >56 mL/min   Anion gap 10 5 - 15  POC urine preg, ED   Collection Time: 05/12/23  3:38 PM  Result Value Ref Range   Preg Test, Ur NEGATIVE NEGATIVE      Assessment & Plan:   Problem List Items Addressed This Visit   None Visit Diagnoses       Acne, unspecified acne type    -  Primary   Will treat with benzol paroxide wash and Clindamycin Gel.  Follow up if not improved.   Relevant Medications   clindamycin (CLINDAGEL) 1 % gel   benzoyl peroxide 5 % external liquid        Follow up plan: Return if symptoms worsen or fail to improve.

## 2023-11-28 DIAGNOSIS — R55 Syncope and collapse: Secondary | ICD-10-CM | POA: Diagnosis not present

## 2023-11-28 DIAGNOSIS — R002 Palpitations: Secondary | ICD-10-CM | POA: Diagnosis not present

## 2023-11-28 DIAGNOSIS — R000 Tachycardia, unspecified: Secondary | ICD-10-CM | POA: Diagnosis not present

## 2023-11-28 DIAGNOSIS — R42 Dizziness and giddiness: Secondary | ICD-10-CM | POA: Diagnosis not present

## 2024-03-07 ENCOUNTER — Ambulatory Visit
Admission: EM | Admit: 2024-03-07 | Discharge: 2024-03-07 | Disposition: A | Discharge status: Home / Self Care | Attending: Emergency Medicine | Admitting: Emergency Medicine

## 2024-03-07 ENCOUNTER — Encounter: Payer: Self-pay | Admitting: Emergency Medicine

## 2024-03-07 DIAGNOSIS — J0110 Acute frontal sinusitis, unspecified: Secondary | ICD-10-CM

## 2024-03-07 MED ORDER — IPRATROPIUM BROMIDE 0.03 % NA SOLN: 2.0000 | Freq: Two times a day (BID) | NASAL | 0 refills | Status: AC

## 2024-03-07 MED ORDER — PREDNISONE 10 MG (21) PO TBPK: ORAL_TABLET | Freq: Every day | ORAL | 0 refills | Status: DC

## 2024-03-07 MED ORDER — AMOXICILLIN-POT CLAVULANATE 875-125 MG PO TABS: 1.0000 | ORAL_TABLET | Freq: Two times a day (BID) | ORAL | 0 refills | Status: DC

## 2024-03-07 NOTE — Discharge Instructions (Addendum)
 Begin twice daily for 7 days to clear bacteria contributing to symptoms  Starting tomorrow take prednisone  every morning with food as directed to help reduce sinus pressure, avoid ibuprofen  while taking but may use Tylenol   You may use nasal spray twice a daily in addition to the over-the-counter medicines to help further reduce congestion  You can take Tylenol   as needed for fever reduction and pain relief.   For cough: honey 1/2 to 1 teaspoon (you can dilute the honey in water or another fluid).  You can also use guaifenesin  and dextromethorphan for cough. You can use a humidifier for chest congestion and cough.  If you don't have a humidifier, you can sit in the bathroom with the hot shower running.      For sore throat: try warm salt water gargles, cepacol lozenges, throat spray, warm tea or water with lemon/honey, popsicles or ice, or OTC cold relief medicine for throat discomfort.   For congestion: take a daily anti-histamine like Zyrtec, Claritin, and a oral decongestant, such as pseudoephedrine.  You can also use Flonase  1-2 sprays in each nostril daily.   It is important to stay hydrated: drink plenty of fluids (water, gatorade/powerade/pedialyte, juices, or teas) to keep your throat moisturized and help further relieve irritation/discomfort.

## 2024-03-07 NOTE — ED Triage Notes (Signed)
 Pt presents with sinus pressure, congestion and scratchy throat x 10 days. Pt has tried OTC cold medications with no relief. 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

## 2024-03-08 NOTE — ED Provider Notes (Signed)
 Michelle Stokes    CSN: [id number] Arrival date & time: 03/07/24  1446      History   Chief Complaint Chief Complaint  Patient presents with   Nasal Congestion   Sore Throat    HPI Michelle Stokes is a 18 y.o. female.   Patient presents for evaluation of nasal congestion, productive cough, bilateral ear fullness, sinus pressure to the cheeks, sore throat and postnasal drip present for 10 days.  Initially thought to be viral illness, works in the emergency department with known sick contacts.  Has attempted use of Sudafed, steam, Nettie pot and nasal rinses with only minimal relief.  Tolerable to food and liquids but appetite is decreased.  Denies fever, shortness of breath or wheezing.  History of asthma.  Past Medical History:  Diagnosis Date   Allergy    seasonal    Anxiety    Asthma    Chronic kidney disease    Depression    GERD (gastroesophageal reflux disease)    IBS (irritable bowel syndrome)    Migraine    POTS (postural orthostatic tachycardia syndrome)    Torticollis, congenital     Patient Active Problem List   Diagnosis Date Noted   Need for influenza vaccination 05/17/2023   Need for MMR vaccine 05/17/2023   Chest pressure 12/08/2022   Cutaneous abscess of left axilla 03/14/2022   Dysphagia 02/23/2022   Dizziness 01/08/2022   POTS (postural orthostatic tachycardia syndrome) 01/08/2022   GERD (gastroesophageal reflux disease) 10/06/2021   Renal scarring 09/21/2021   Overweight (BMI 25.0-29.9) 05/30/2020   Recurrent major depressive disorder, in partial remission (HCC) 03/14/2020   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   Insomnia 08/10/2018   Migraine 07/06/2018   Anxiety 07/06/2018   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   Allergic rhinitis, seasonal 05/17/2015   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   Mild intermittent asthma without complication 05/17/2015   Recurrent UTI (urinary tract infection) 05/17/2015   VUR (vesicoureteric reflux) 05/17/2015    Sacroiliitis (HCC) 05/04/2015   Chronic pain syndrome 05/04/2015   Exotropia, intermittent, 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 Authorizing Provider  amoxicillin -clavulanate (AUGMENTIN ) 875-125 MG tablet Take 1 tablet by mouth every 12 (twelve) hours. 03/07/24  Yes Satoya Feeley R, NP  ipratropium (ATROVENT ) 0.03 % nasal spray Place 2 sprays into both nostrils every 12 (twelve) hours. 03/07/24  Yes Santiago Stenzel R, NP  predniSONE  (STERAPRED UNI-PAK 21 TAB) 10 MG (21) TBPK tablet Take by mouth daily. Take 6 tabs by mouth daily  for 1 days, then 5 tabs for 1 days, then 4 tabs for 1 days, then 3 tabs for 1 days, 2 tabs for 1 days, then 1 tab by mouth daily for 1 days 03/07/24  Yes Iley Breeden R, NP  acetaminophen  (TYLENOL ) 325 MG  tablet Take 150 mg by mouth every 6 (six) hours as needed.    [provider]  albuterol  (PROVENTIL ) (2.5 MG/3ML) 0.083% nebulizer solution Take 3 mLs (2.5 mg total) by nebulization every 6 (six) hours as needed for wheezing or shortness of breath. 11/04/19   Cannady, Jolene T, NP  Albuterol -Budesonide  90-80 MCG/ACT AERO Inhale 1 Inhaler into the lungs every 6 (six) hours as needed. 12/07/22   Pearley, Hyla Givens, NP  benzoyl peroxide  5 % external liquid Apply topically 2 (two) times daily. 11/06/23   Melvin Pao, NP  Cholecalciferol  1.25 MG (50000 UT) TABS Take 1 tablet by mouth 2 (two) times a week. For 12 weeks and then stop.  Return to office for lab draw. 11/22/22   Melvin Pao, NP  clindamycin  (CLINDAGEL) 1 %  gel Apply topically 2 (two) times daily. 11/06/23   Melvin Pao, NP  fluconazole  (DIFLUCAN ) 150 MG tablet Take 1 tablet (150 mg total) by mouth every three (3) days as needed. May repeat in 3 days if symptoms not resolved 02/22/23   Mecum, Erin E, PA-C  lubiprostone (AMITIZA) 24 MCG capsule Take 24 mcg by mouth 2 (two) times daily. 01/05/23   [provider]  meclizine  (ANTIVERT ) 25 MG tablet Take 1 tablet (25 mg total) by mouth 3 (three) times daily as needed for dizziness. 02/25/22   Ernest Ronal BRAVO, MD  medroxyPROGESTERone  (PROVERA ) 10 MG tablet Take 1 tablet (10 mg total) by mouth daily for 7 days. 09/12/23 11/06/23  Copland, Alicia B, PA-C  midodrine  (PROAMATINE ) 2.5 MG tablet Take 1 tablet (2.5 mg total) by mouth 2 (two) times daily with a meal. 09/09/23   Melvin Pao, NP  ondansetron  (ZOFRAN ) 4 MG tablet Take 1 tablet (4 mg total) by mouth every 8 (eight) hours as needed for nausea or vomiting. 02/21/23   Mecum, Erin E, PA-C  ondansetron  (ZOFRAN -ODT) 4 MG disintegrating tablet Take 1 tablet (4 mg total) by mouth every 8 (eight) hours as needed for nausea or vomiting. 11/21/21   Lang Dover, MD  pantoprazole (PROTONIX) 40 MG tablet Take 40 mg by mouth daily. 02/24/22   [provider]    Family History Family History  Problem Relation Age of Onset   Asthma Mother    Asthma Father    Depression Sister    Pancreatic cancer Maternal Grandfather    Kidney disease Paternal Grandmother     Social History Social History   Tobacco Use   Smoking status: Never   Smokeless tobacco: Never  Vaping Use   Vaping status: Never Used  Substance Use Topics   Alcohol use: No   Drug use: No     Allergies   Emgality [galcanezumab-gnlm], Influenza virus vaccine, Ajovy [fremanezumab-vfrm], Cephalexin , Mixed ragweed, Other, and Tamsulosin   Review of Systems Review of Systems   Physical Exam Triage Vital Signs ED Triage Vitals [03/07/24 1520]  Encounter Vitals Group      BP 104/71     Girls Systolic BP Percentile      Girls Diastolic BP Percentile      Boys Systolic BP Percentile      Boys Diastolic BP Percentile      Pulse Rate 75     Resp 16     Temp 98.2 F (36.8 C)     Temp Source Oral     SpO2 97 %     Weight      Height      Head Circumference  Peak Flow      Pain Score 0     Pain Loc      Pain Education      Exclude from Growth Chart    No data found.  Updated Vital Signs BP 104/71 (BP Location: Left Arm)   Pulse 75   Temp 98.2 F (36.8 C) (Oral)   Resp 16   SpO2 97%   Visual Acuity Right Eye Distance:   Left Eye Distance:   Bilateral Distance:    Right Eye Near:   Left Eye Near:    Bilateral Near:     Physical Exam Constitutional:      Appearance: Normal appearance.  HENT:     Right Ear: Tympanic membrane, ear canal and external ear normal.     Left Ear: Tympanic membrane, ear canal and external ear normal.     Nose: Congestion present.     Right Sinus: Maxillary sinus tenderness present.     Left Sinus: Maxillary sinus tenderness present.     Mouth/Throat:     Mouth: Mucous membranes are moist.     Pharynx: Oropharynx is clear.  Eyes:     Extraocular Movements: Extraocular movements intact.  Cardiovascular:     Rate and Rhythm: Normal rate and regular rhythm.     Pulses: Normal pulses.     Heart sounds: Normal heart sounds.  Pulmonary:     Effort: Pulmonary effort is normal.     Breath sounds: Normal breath sounds.  Musculoskeletal:     Cervical back: Normal range of motion and neck supple.  Neurological:     Mental Status: She is alert and oriented to person, place, and time. Mental status is at baseline.      UC Treatments / Results  Labs (all labs ordered are listed, but only abnormal results are displayed) Labs Reviewed - No data to display  EKG   Radiology No results found.  Procedures Procedures (including critical care time)  Medications Ordered in UC Medications - No data to  display  Initial Impression / Assessment and Plan / UC Course  I have reviewed the triage vital signs and the nursing notes.  Pertinent labs & imaging results that were available during my care of the patient were reviewed by me and considered in my medical decision making (see chart for details).  Acute nonrecurrent frontal sinusitis Patient is in no signs of distress nor toxic appearing.  Vital signs are stable.  Low suspicion for pneumonia, pneumothorax or bronchitis and therefore will defer imaging.  Viral testing deferred due to timeline of illness.  Symptomology consistent with sinusitis, tenderness noted on exam, placed on Augmentin , prednisone  and ipratropium nasal spray.May use additional over-the-counter medications as needed for supportive care.  May follow-up with urgent care as needed if symptoms persist or worsen.   Final Clinical Impressions(s) / UC Diagnoses   Final diagnoses:  Acute non-recurrent frontal sinusitis     Discharge Instructions      Begin twice daily for 7 days to clear bacteria contributing to symptoms  Starting tomorrow take prednisone  every morning with food as directed to help reduce sinus pressure, avoid ibuprofen  while taking but may use Tylenol   You may use nasal spray twice a daily in addition to the over-the-counter medicines to help further reduce congestion  You can take Tylenol   as needed for fever reduction and pain relief.   For cough: honey 1/2 to 1 teaspoon (you can dilute the honey in water or another fluid).  You can also use guaifenesin  and dextromethorphan for cough. You can use a humidifier for chest congestion and cough.  If you don't have a humidifier, you can sit in the bathroom with the hot shower running.      For sore throat: try warm salt water gargles, cepacol lozenges, throat spray, warm tea or water with lemon/honey, popsicles or ice, or OTC cold relief medicine for throat discomfort.   For congestion: take a daily  anti-histamine like Zyrtec, Claritin, and a oral decongestant, such as pseudoephedrine.  You can also use Flonase  1-2 sprays in each nostril daily.   It is important to stay hydrated: drink plenty of fluids (water, gatorade/powerade/pedialyte, juices, or teas) to keep your throat moisturized and help further relieve irritation/discomfort.    ED Prescriptions     Medication Sig Dispense Auth. Provider   amoxicillin -clavulanate (AUGMENTIN ) 875-125 MG tablet Take 1 tablet by mouth every 12 (twelve) hours. 14 tablet Soo Steelman R, NP   predniSONE  (STERAPRED UNI-PAK 21 TAB) 10 MG (21) TBPK tablet Take by mouth daily. Take 6 tabs by mouth daily  for 1 days, then 5 tabs for 1 days, then 4 tabs for 1 days, then 3 tabs for 1 days, 2 tabs for 1 days, then 1 tab by mouth daily for 1 days 21 tablet Averly Ericson R, NP   ipratropium (ATROVENT ) 0.03 % nasal spray Place 2 sprays into both nostrils every 12 (twelve) hours. 30 mL Teresa Shelba SAUNDERS, NP      PDMP not reviewed this encounter.   Teresa Shelba SAUNDERS, [place] 03/08/24 [phone]

## 2024-04-01 ENCOUNTER — Encounter: Payer: Self-pay | Admitting: Nurse Practitioner

## 2024-04-28 DIAGNOSIS — M9903 Segmental and somatic dysfunction of lumbar region: Secondary | ICD-10-CM | POA: Diagnosis not present

## 2024-04-28 DIAGNOSIS — M9905 Segmental and somatic dysfunction of pelvic region: Secondary | ICD-10-CM | POA: Diagnosis not present

## 2024-04-28 DIAGNOSIS — M531 Cervicobrachial syndrome: Secondary | ICD-10-CM | POA: Diagnosis not present

## 2024-04-28 DIAGNOSIS — M9902 Segmental and somatic dysfunction of thoracic region: Secondary | ICD-10-CM | POA: Diagnosis not present

## 2024-04-28 DIAGNOSIS — M546 Pain in thoracic spine: Secondary | ICD-10-CM | POA: Diagnosis not present

## 2024-04-28 DIAGNOSIS — M545 Low back pain, unspecified: Secondary | ICD-10-CM | POA: Diagnosis not present

## 2024-04-28 DIAGNOSIS — M955 Acquired deformity of pelvis: Secondary | ICD-10-CM | POA: Diagnosis not present

## 2024-04-28 DIAGNOSIS — M9901 Segmental and somatic dysfunction of cervical region: Secondary | ICD-10-CM | POA: Diagnosis not present

## 2024-05-03 DIAGNOSIS — R55 Syncope and collapse: Secondary | ICD-10-CM | POA: Diagnosis not present

## 2024-05-03 DIAGNOSIS — R000 Tachycardia, unspecified: Secondary | ICD-10-CM | POA: Diagnosis not present

## 2024-05-04 ENCOUNTER — Ambulatory Visit: Payer: Self-pay

## 2024-05-04 ENCOUNTER — Ambulatory Visit

## 2024-05-04 VITALS — BP 128/76 | HR 114 | Ht 66.0 in | Wt 166.2 lb

## 2024-05-04 DIAGNOSIS — J4520 Mild intermittent asthma, uncomplicated: Secondary | ICD-10-CM

## 2024-05-04 DIAGNOSIS — J069 Acute upper respiratory infection, unspecified: Secondary | ICD-10-CM | POA: Diagnosis not present

## 2024-05-04 DIAGNOSIS — J019 Acute sinusitis, unspecified: Secondary | ICD-10-CM | POA: Insufficient documentation

## 2024-05-04 DIAGNOSIS — J0110 Acute frontal sinusitis, unspecified: Secondary | ICD-10-CM

## 2024-05-04 MED ORDER — AMOXICILLIN-POT CLAVULANATE 875-125 MG PO TABS: 1.0000 | ORAL_TABLET | Freq: Two times a day (BID) | ORAL | 0 refills | Status: AC

## 2024-05-04 NOTE — Telephone Encounter (Signed)
 FYI Only or Action Required?: Action required by provider: request for appointment.  Patient was last seen in primary care on 11/06/2023 by Melvin Pao, NP.  Called Nurse Triage reporting Facial Pain.  Symptoms began several days ago.  Interventions attempted: Nothing.  Symptoms are: gradually worsening. Sinus pain, sore throat, fatigue, headache, body aches.  Triage Disposition: See HCP Within 4 Hours (Or PCP Triage)  Patient/caregiver understands and will follow disposition?: Yes    Copied from CRM [id number]. Topic: Clinical - Red Word Triage >> May 04, 2024 10:21 AM Alfonso HERO wrote: Patient has muscle aches, sore throat, nasal congestion and headaches Answer Assessment - Initial Assessment Questions 1. LOCATION: Where does it hurt?      Face, nose 2. ONSET: When did the sinus pain start?  (e.g., hours, days)      Saturday 3. SEVERITY: How bad is the pain?   (Scale 0-10; or none, mild, moderate or severe)     8 4. RECURRENT SYMPTOM: Have you ever had sinus problems before? If Yes, ask: When was the last time? and What happened that time?      yes 5. NASAL CONGESTION: Is the nose blocked? If Yes, ask: Can you open it or must you breathe through your mouth?     no 6. NASAL DISCHARGE: Do you have discharge from your nose? If so ask, What color?     green 7. FEVER: Do you have a fever? If Yes, ask: What is it, how was it measured, and when did it start?      no 8. OTHER SYMPTOMS: Do you have any other symptoms? (e.g., sore throat, cough, earache, difficulty breathing)     Headache, fatigue,headache 9. PREGNANCY: Is there any chance you are pregnant? When was your last menstrual period?     no  Protocols used: Sinus Pain or Congestion-A-AH  Reason for Disposition  [1] SEVERE sinus pain (e.g., excruciating) AND [2] not improved 2 hours after pain medicine  Answer Assessment - Initial Assessment Questions 1. LOCATION: Where does it hurt?       Face, nose 2. ONSET: When did the sinus pain start?  (e.g., hours, days)      Saturday 3. SEVERITY: How bad is the pain?   (Scale 0-10; or none, mild, moderate or severe)     8 4. RECURRENT SYMPTOM: Have you ever had sinus problems before? If Yes, ask: When was the last time? and What happened that time?      yes 5. NASAL CONGESTION: Is the nose blocked? If Yes, ask: Can you open it or must you breathe through your mouth?     no 6. NASAL DISCHARGE: Do you have discharge from your nose? If so ask, What color?     green 7. FEVER: Do you have a fever? If Yes, ask: What is it, how was it measured, and when did it start?      no 8. OTHER SYMPTOMS: Do you have any other symptoms? (e.g., sore throat, cough, earache, difficulty breathing)     Headache, fatigue,headache 9. PREGNANCY: Is there any chance you are pregnant? When was your last menstrual period?     no  Protocols used: Sinus Pain or Congestion-A-AH

## 2024-05-04 NOTE — Progress Notes (Unsigned)
 Acute Patient Visit  Physician: Nailani Full A Xan Ingraham, MD  Patient: Michelle Stokes MRN: [id number] DOB: [birth year] PCP: Melvin Pao, NP     Subjective:   Chief Complaint  Patient presents with   Sore Throat    Patient is concerned about sore throat started Saturday. States her nose keeps bleeding and she is heaving body aches.     HPI: The patient is a 18 y.o. female who presents today for:   Discussed the use of AI scribe software for clinical note transcription with the patient, who gave verbal consent to proceed.  History of Present Illness   Michelle Stokes is a 18 year old female with asthma who presents with sinus congestion and sore throat.  Upper respiratory symptoms - Nasal congestion since Saturday night - Purulent nasal discharge with intermittent epistaxis onset between last night and today - Sore throat since Saturday night, chills - Significant frontal sinus pressure, worse than previous sinus infection in September - Continues to feel generally unwell despite symptomatic treatments including nasal spray, Robitussin, sore throat spray, Recolas cough drops, Tylenol , and ibuprofen   Asthma exacerbation - History of asthma with prior hospitalization in childhood - Asthma symptoms worsen during illness - currently mildly SOB - Uses inhaler only when sick; last used during previous sinus infection - Home inhaler available  Postural orthostatic tachycardia syndrome (pots) symptoms - Current heart monitoring for POTS symptoms - Aware that albuterol  use increases heart rate  Impact on daily functioning - 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   Mild intermittent asthma without  complication Yes   URTI (acute upper respiratory infection)    Acute non-recurrent frontal sinusitis     Orders Placed This Encounter  Procedures   POC COVID-19 BinaxNow    Assessment and Plan     Acute sinusitis and acute upper respiratory infection   Symptoms and purulent discharge indicate bacterial sinusitis. COVID-19 and influenza tests are  conducted negative. Prescribed amoxicillin  for bacterial sinusitis.  Advised rest and increased fluid intake. Provided a work note for Thursday. Rest, fluids.  F/u if any changes  Mild intermittent asthma   Current illness has exacerbated asthma, though no wheezing or significant cough is present. Advised inhaler use for shortness of breath which she has not used at all. Instructed to monitor symptoms and contact if shortness of breath persists despite inhaler use.   Lungs clear on examination, normal SaO2.      OBJECTIVE: Vitals:   05/04/24 1304  BP: 128/76  Pulse: (!) 114  SpO2: 98%  Weight: 166 lb 3.2 oz (75.4 kg)  Height: 5' 6 (1.676 m)    Body mass index is 26.83 kg/m.   Physical Exam Vitals reviewed.  Constitutional:      Appearance: Appears mildly unwell. Well-developed with normal weight.  Cardiovascular:     Rate and Rhythm: HR elevated and regular rhythm. Normal heart sounds. Normal peripheral pulses Pulmonary:     Normal breath sounds with normal effort Skin:    General: Skin is warm and dry without noticeable rash. Neurological:     General: No focal deficit present.  Psychiatric:  Mood and Affect: Mood, behavior and cognition normal   HEENT:  Posterior oropharynx with mild erythema, no exhudates      Allergies Patient is allergic to emgality [galcanezumab-gnlm], influenza virus vaccine, ajovy [fremanezumab-vfrm], cephalexin , mixed ragweed, other, and tamsulosin.  Past Medical History Patient  has a past medical history of Allergy, Anxiety, Asthma, Chronic kidney disease, Depression, GERD  (gastroesophageal reflux disease), IBS (irritable bowel syndrome), Migraine, POTS (postural orthostatic tachycardia syndrome), and Torticollis, congenital.  Surgical History Patient  has a past surgical history that includes Eye muscle surgery (Bilateral) and Wisdom tooth extraction.  Family History Pateint's family history includes Asthma in her father and mother; Depression in her sister; Kidney disease in her paternal grandmother; Pancreatic cancer in her maternal grandfather.  Social History Patient  reports that she has never smoked. She has never used smokeless tobacco. She reports that she does not drink alcohol and does not use drugs.    05/04/2024

## 2024-05-05 ENCOUNTER — Telehealth: Payer: Self-pay

## 2024-05-05 DIAGNOSIS — J4520 Mild intermittent asthma, uncomplicated: Secondary | ICD-10-CM

## 2024-05-05 MED ORDER — ALBUTEROL-BUDESONIDE 90-80 MCG/ACT IN AERO: 1.0000 | INHALATION_SPRAY | Freq: Four times a day (QID) | RESPIRATORY_TRACT | 1 refills | Status: AC | PRN

## 2024-05-05 NOTE — Telephone Encounter (Signed)
Inhaler sent to the pharmacy 

## 2024-05-05 NOTE — Telephone Encounter (Signed)
 Copied from CRM [id number]. Topic: Clinical - Medication Question >> May 05, 2024  2:48 PM Pinkey ORN wrote: Patient is requesting that an inhaler is sent into the pharmacy. Patient states the one she has is expired. Please follow up with patient.

## 2024-05-05 NOTE — Addendum Note (Signed)
 Addended by: MELVIN PAO on: 05/05/2024 02:58 PM   Modules accepted: Orders

## 2024-05-15 DIAGNOSIS — R000 Tachycardia, unspecified: Secondary | ICD-10-CM | POA: Diagnosis not present

## 2024-05-15 DIAGNOSIS — R55 Syncope and collapse: Secondary | ICD-10-CM | POA: Diagnosis not present

## 2024-06-02 DIAGNOSIS — R0789 Other chest pain: Secondary | ICD-10-CM | POA: Diagnosis not present

## 2024-06-02 DIAGNOSIS — R55 Syncope and collapse: Secondary | ICD-10-CM | POA: Diagnosis not present

## 2024-06-02 DIAGNOSIS — R42 Dizziness and giddiness: Secondary | ICD-10-CM | POA: Diagnosis not present

## 2024-06-02 DIAGNOSIS — R000 Tachycardia, unspecified: Secondary | ICD-10-CM | POA: Diagnosis not present

## 2024-06-02 DIAGNOSIS — Z79899 Other long term (current) drug therapy: Secondary | ICD-10-CM | POA: Diagnosis not present

## 2024-06-02 DIAGNOSIS — R251 Tremor, unspecified: Secondary | ICD-10-CM | POA: Diagnosis not present

## 2024-07-28 ENCOUNTER — Ambulatory Visit: Admitting: Nurse Practitioner

## 2024-07-28 NOTE — Progress Notes (Unsigned)
" ° °  There were no vitals taken for this visit.   Subjective:    Patient ID: Michelle Stokes, female    DOB: 08-02-2005, 19 y.o.   MRN: [id number]  HPI: Michelle Stokes is a 19 y.o. female  No chief complaint on file.   Relevant past medical, surgical, family and social history reviewed and updated as indicated. Interim medical history since our last visit reviewed. Allergies and medications reviewed and updated.  Review of Systems  Per HPI unless specifically indicated above     Objective:    There were no vitals taken for this visit.  Wt Readings from Last 3 Encounters:  05/04/24 166 lb 3.2 oz (75.4 kg)  11/06/23 165 lb 12.8 oz (75.2 kg) (90%, Z= 1.28)*  05/17/23 176 lb 12.8 oz (80.2 kg) (94%, Z= 1.54)*   * Growth percentiles are based on CDC (Girls, 2-20 Years) data.    Physical Exam  Results for orders placed or performed during the hospital encounter of 05/12/23  CBC with Differential   Collection Time: 05/12/23  3:27 PM  Result Value Ref Range   WBC 7.1 4.0 - 10.5 K/uL   RBC 4.63 3.87 - 5.11 MIL/uL   Hemoglobin 13.6 12.0 - 15.0 g/dL   HCT 59.0 63.9 - 53.9 %   MCV 88.3 80.0 - 100.0 fL   MCH 29.4 26.0 - 34.0 pg   MCHC 33.3 30.0 - 36.0 g/dL   RDW 87.0 88.4 - 84.4 %   Platelets 204 150 - 400 K/uL   nRBC 0.0 0.0 - 0.2 %   Neutrophils Relative % 51 %   Neutro Abs 3.6 1.7 - 7.7 K/uL   Lymphocytes Relative 36 %   Lymphs Abs 2.5 0.7 - 4.0 K/uL   Monocytes Relative 11 %   Monocytes Absolute 0.8 0.1 - 1.0 K/uL   Eosinophils Relative 1 %   Eosinophils Absolute 0.1 0.0 - 0.5 K/uL   Basophils Relative 1 %   Basophils Absolute 0.1 0.0 - 0.1 K/uL   Immature Granulocytes 0 %   Abs Immature Granulocytes 0.02 0.00 - 0.07 K/uL  Basic metabolic panel   Collection Time: 05/12/23  3:27 PM  Result Value Ref Range   Sodium 139 135 - 145 mmol/L   Potassium 3.4 (L) 3.5 - 5.1 mmol/L   Chloride 102 98 - 111 mmol/L   CO2 27 22 - 32 mmol/L   Glucose, Bld 96 70 - 99 mg/dL    BUN 17 6 - 20 mg/dL   Creatinine, Ser 9.27 0.44 - 1.00 mg/dL   Calcium 9.1 8.9 - 89.6 mg/dL   GFR, Estimated >39 >39 mL/min   Anion gap 10 5 - 15  POC urine preg, ED   Collection Time: 05/12/23  3:38 PM  Result Value Ref Range   Preg Test, Ur NEGATIVE NEGATIVE      Assessment & Plan:   Problem List Items Addressed This Visit   None    Follow up plan: No follow-ups on file.      "

## 2024-07-29 ENCOUNTER — Encounter: Payer: Self-pay | Admitting: Nurse Practitioner

## 2024-07-29 ENCOUNTER — Ambulatory Visit: Admitting: Nurse Practitioner

## 2024-07-29 VITALS — BP 107/67 | HR 67 | Temp 98.0°F | Ht 65.98 in | Wt 169.8 lb

## 2024-07-29 DIAGNOSIS — N3000 Acute cystitis without hematuria: Secondary | ICD-10-CM

## 2024-07-29 DIAGNOSIS — R300 Dysuria: Secondary | ICD-10-CM

## 2024-07-29 LAB — URINALYSIS, ROUTINE W REFLEX MICROSCOPIC
Bilirubin, UA: NEGATIVE
Glucose, UA: NEGATIVE
Ketones, UA: NEGATIVE
Leukocytes,UA: NEGATIVE
Nitrite, UA: NEGATIVE
Protein,UA: NEGATIVE
RBC, UA: NEGATIVE
Specific Gravity, UA: 1.025 (ref 1.005–1.030)
Urobilinogen, Ur: 0.2 mg/dL (ref 0.2–1.0)
pH, UA: 7.5 (ref 5.0–7.5)

## 2024-07-29 LAB — WET PREP FOR TRICH, YEAST, CLUE
Clue Cell Exam: NEGATIVE
Trichomonas Exam: NEGATIVE
Yeast Exam: NEGATIVE

## 2024-07-29 MED ORDER — NITROFURANTOIN MONOHYD MACRO 100 MG PO CAPS: 100.0000 mg | ORAL_CAPSULE | Freq: Two times a day (BID) | ORAL | 0 refills | Status: AC

## 2024-07-29 NOTE — Progress Notes (Signed)
 "  BP 107/67 (BP Location: Left Arm, Patient Position: Sitting, Cuff Size: Normal)   Pulse 67   Temp 98 F (36.7 C) (Oral)   Ht 5' 5.98 (1.676 m)   Wt 169 lb 12.8 oz (77 kg)   LMP 07/13/2024 (Approximate)   SpO2 100%   BMI 27.42 kg/m    Subjective:    Patient ID: Michelle Stokes, female    DOB: 2006-05-16, 18 y.o.   MRN: [id number]  HPI: Michelle Stokes is a 19 y.o. female  Chief Complaint  Patient presents with   Urinary Tract Infection    Patient stated she noticed discharge(thick & white), it burns when she pee's, and there is pain in her side/back area. The symptoms have been going on for almost 2 weeks.   Vaginitis   URINARY SYMPTOMS Symptoms started about 2-3 weeks ago. White discharge. Dysuria: yes Urinary frequency: no Urgency: no Small volume voids: no Symptom severity: no Urinary incontinence: no Foul odor: no Hematuria: no Abdominal pain: yes Back pain: yes Suprapubic pain/pressure: yes Flank pain: no Fever:  no Vomiting: no Relief with cranberry juice: no Relief with pyridium : no Status: worse Previous urinary tract infection: yes Recurrent urinary tract infection: yes Sexual activity: No sexually active/monogomous/practicing safe sex History of sexually transmitted disease: no Penile discharge: no Treatments attempted: tylenol  and increasing water   Relevant past medical, surgical, family and social history reviewed and updated as indicated. Interim medical history since our last visit reviewed. Allergies and medications reviewed and updated.  Review of Systems  Constitutional:  Negative for fever.  Gastrointestinal:  Negative for abdominal pain and vomiting.  Genitourinary:  Positive for dysuria and frequency. Negative for decreased urine volume, flank pain, hematuria and urgency.  Musculoskeletal:  Negative for back pain.    Per HPI unless specifically indicated above     Objective:    BP 107/67 (BP Location: Left Arm, Patient  Position: Sitting, Cuff Size: Normal)   Pulse 67   Temp 98 F (36.7 C) (Oral)   Ht 5' 5.98 (1.676 m)   Wt 169 lb 12.8 oz (77 kg)   LMP 07/13/2024 (Approximate)   SpO2 100%   BMI 27.42 kg/m   Wt Readings from Last 3 Encounters:  07/29/24 169 lb 12.8 oz (77 kg)  05/04/24 166 lb 3.2 oz (75.4 kg)  11/06/23 165 lb 12.8 oz (75.2 kg) (90%, Z= 1.28)*   * Growth percentiles are based on CDC (Girls, 2-20 Years) data.    Physical Exam Vitals and nursing note reviewed.  Constitutional:      General: She is not in acute distress.    Appearance: Normal appearance. She is normal weight. She is not ill-appearing, toxic-appearing or diaphoretic.  HENT:     Head: Normocephalic.     Right Ear: External ear normal.     Left Ear: External ear normal.     Nose: Nose normal.     Mouth/Throat:     Mouth: Mucous membranes are moist.     Pharynx: Oropharynx is clear.  Eyes:     General:        Right eye: No discharge.        Left eye: No discharge.     Extraocular Movements: Extraocular movements intact.     Conjunctiva/sclera: Conjunctivae normal.     Pupils: Pupils are equal, round, and reactive to light.  Cardiovascular:     Rate and Rhythm: Normal rate and regular rhythm.     Heart sounds: No  murmur heard. Pulmonary:     Effort: Pulmonary effort is normal. No respiratory distress.     Breath sounds: Normal breath sounds. No wheezing or rales.  Abdominal:     General: Abdomen is flat. Bowel sounds are normal. There is no distension.     Palpations: Abdomen is soft. There is no mass.     Tenderness: There is abdominal tenderness. There is no right CVA tenderness, left CVA tenderness, guarding or rebound.     Hernia: No hernia is present.  Musculoskeletal:     Cervical back: Normal range of motion and neck supple.  Skin:    General: Skin is warm and dry.     Capillary Refill: Capillary refill takes less than 2 seconds.  Neurological:     General: No focal deficit present.     Mental  Status: She is alert and oriented to person, place, and time. Mental status is at baseline.  Psychiatric:        Mood and Affect: Mood normal.        Behavior: Behavior normal.        Thought Content: Thought content normal.        Judgment: Judgment normal.     Results for orders placed or performed during the hospital encounter of 05/12/23  CBC with Differential   Collection Time: 05/12/23  3:27 PM  Result Value Ref Range   WBC 7.1 4.0 - 10.5 K/uL   RBC 4.63 3.87 - 5.11 MIL/uL   Hemoglobin 13.6 12.0 - 15.0 g/dL   HCT 59.0 63.9 - 53.9 %   MCV 88.3 80.0 - 100.0 fL   MCH 29.4 26.0 - 34.0 pg   MCHC 33.3 30.0 - 36.0 g/dL   RDW 87.0 88.4 - 84.4 %   Platelets 204 150 - 400 K/uL   nRBC 0.0 0.0 - 0.2 %   Neutrophils Relative % 51 %   Neutro Abs 3.6 1.7 - 7.7 K/uL   Lymphocytes Relative 36 %   Lymphs Abs 2.5 0.7 - 4.0 K/uL   Monocytes Relative 11 %   Monocytes Absolute 0.8 0.1 - 1.0 K/uL   Eosinophils Relative 1 %   Eosinophils Absolute 0.1 0.0 - 0.5 K/uL   Basophils Relative 1 %   Basophils Absolute 0.1 0.0 - 0.1 K/uL   Immature Granulocytes 0 %   Abs Immature Granulocytes 0.02 0.00 - 0.07 K/uL  Basic metabolic panel   Collection Time: 05/12/23  3:27 PM  Result Value Ref Range   Sodium 139 135 - 145 mmol/L   Potassium 3.4 (L) 3.5 - 5.1 mmol/L   Chloride 102 98 - 111 mmol/L   CO2 27 22 - 32 mmol/L   Glucose, Bld 96 70 - 99 mg/dL   BUN 17 6 - 20 mg/dL   Creatinine, Ser 9.27 0.44 - 1.00 mg/dL   Calcium 9.1 8.9 - 89.6 mg/dL   GFR, Estimated >39 >39 mL/min   Anion gap 10 5 - 15  POC urine preg, ED   Collection Time: 05/12/23  3:38 PM  Result Value Ref Range   Preg Test, Ur NEGATIVE NEGATIVE      Assessment & Plan:   Problem List Items Addressed This Visit   None Visit Diagnoses       Acute cystitis without hematuria    -  Primary   Wet Prep neg. Will treat with macrobid .  Complete course of antibiotics.  Follow up if not improved.   Relevant Orders   Urine Culture  Dysuria       Relevant Orders   Urinalysis, Routine w reflex microscopic   WET PREP FOR TRICH, YEAST, CLUE        Follow up plan: No follow-ups on file.      "

## 2024-07-30 ENCOUNTER — Ambulatory Visit: Payer: Self-pay | Admitting: Nurse Practitioner

## 2024-07-31 LAB — URINE CULTURE
